# Patient Record
Sex: Male | Born: 1960 | Hispanic: No | Marital: Married | Smoking: Never smoker
Health system: Southern US, Community
[De-identification: ages and names within clinical notes are randomized; demographics above are authoritative.]

## PROBLEM LIST (undated history)

## (undated) DIAGNOSIS — E785 Hyperlipidemia, unspecified: Secondary | ICD-10-CM

## (undated) DIAGNOSIS — N200 Calculus of kidney: Secondary | ICD-10-CM

## (undated) HISTORY — PX: TONSILLECTOMY AND ADENOIDECTOMY: SUR1326

## (undated) HISTORY — PX: VASECTOMY: SHX75

## (undated) HISTORY — DX: Hyperlipidemia, unspecified: E78.5

## (undated) HISTORY — PX: OTHER SURGICAL HISTORY: SHX169

## (undated) HISTORY — PX: INGUINAL HERNIA REPAIR: SUR1180

## (undated) HISTORY — DX: Calculus of kidney: N20.0

## (undated) HISTORY — PX: KNEE ARTHROSCOPY W/ MEDIAL COLLATERAL LIGAMENT (MCL) REPAIR: SHX1876

---

## 2002-05-25 ENCOUNTER — Ambulatory Visit (HOSPITAL_COMMUNITY): Admission: RE | Admit: 2002-05-25 | Discharge: 2002-05-25 | Payer: Self-pay | Admitting: Surgery

## 2005-05-24 ENCOUNTER — Emergency Department (HOSPITAL_COMMUNITY): Admission: EM | Admit: 2005-05-24 | Discharge: 2005-05-24 | Payer: Self-pay | Admitting: Family Medicine

## 2008-06-18 ENCOUNTER — Encounter: Admission: RE | Admit: 2008-06-18 | Discharge: 2008-06-18 | Payer: Self-pay | Admitting: Internal Medicine

## 2010-11-16 ENCOUNTER — Ambulatory Visit
Admission: RE | Admit: 2010-11-16 | Discharge: 2010-11-16 | Disposition: A | Discharge status: Home / Self Care | Payer: 59 | Source: Ambulatory Visit | Attending: Emergency Medicine | Admitting: Emergency Medicine

## 2010-11-16 ENCOUNTER — Ambulatory Visit (INDEPENDENT_AMBULATORY_CARE_PROVIDER_SITE_OTHER): Payer: 59 | Admitting: Emergency Medicine

## 2010-11-16 ENCOUNTER — Encounter: Payer: Self-pay | Admitting: Emergency Medicine

## 2010-11-16 ENCOUNTER — Other Ambulatory Visit: Payer: Self-pay | Admitting: Emergency Medicine

## 2010-11-16 DIAGNOSIS — M25579 Pain in unspecified ankle and joints of unspecified foot: Secondary | ICD-10-CM

## 2010-11-19 ENCOUNTER — Telehealth (INDEPENDENT_AMBULATORY_CARE_PROVIDER_SITE_OTHER): Payer: Self-pay | Admitting: *Deleted

## 2010-11-24 NOTE — Assessment & Plan Note (Signed)
Summary: FOOT & ANKLE INJ/WSE Room 5   Vital Signs:  Patient Profile:   50 Years Old Male CC:      Left ankle injury turned it this morning Height:     70 inches Weight:      185 pounds O2 Sat:      96 % O2 treatment:    Room Air Temp:     99.4 degrees F oral Pulse rate:   63 / minute Pulse rhythm:   regular Resp:     12 per minute BP sitting:   107 / 74  (left arm) Cuff size:   regular  Pt. in pain?   yes    Location:   ankle    Intensity:   6    Type:       sharp  Vitals Entered By: Emilio Math (November 16, 2010 5:23 PM)                   Current Allergies: No known allergies History of Present Illness History from: patient Chief Complaint: Left ankle injury turned it this morning History of Present Illness: Missed a stair this morning and fell and inverted his L ankle.  Now pain throughout ankle, worst on the outside and with movement  Not using any meds or modalities.  It is swellilng and radiating up leg.    REVIEW OF SYSTEMS Constitutional Symptoms      Denies fever, chills, night sweats, weight loss, weight gain, and fatigue.  Eyes       Denies change in vision, eye pain, eye discharge, glasses, contact lenses, and eye surgery. Ear/Nose/Throat/Mouth       Denies hearing loss/aids, change in hearing, ear pain, ear discharge, dizziness, frequent runny nose, frequent nose bleeds, sinus problems, sore throat, hoarseness, and tooth pain or bleeding.  Respiratory       Denies dry cough, productive cough, wheezing, shortness of breath, asthma, bronchitis, and emphysema/COPD.  Cardiovascular       Denies murmurs, chest pain, and tires easily with exhertion.    Gastrointestinal       Denies stomach pain, nausea/vomiting, diarrhea, constipation, blood in bowel movements, and indigestion. Genitourniary       Denies painful urination, kidney stones, and loss of urinary control. Neurological       Denies paralysis, seizures, and  fainting/blackouts. Musculoskeletal       Complains of muscle pain, joint pain, and decreased range of motion.      Denies joint stiffness, redness, swelling, muscle weakness, and gout.  Skin       Denies bruising, unusual mles/lumps or sores, and hair/skin or nail changes.  Psych       Denies mood changes, temper/anger issues, anxiety/stress, speech problems, depression, and sleep problems.  Past History:  Past Medical History: Unremarkable  Past Surgical History: Lasix Inguinal herniorrhaphy  Family History: Mother, Healthy Father, Healthy  Social History: Non smoker ETOH-yes No DRugs Dir Pschy Unit Physical Exam General appearance: well developed, well nourished, no acute distress MSE: oriented to time, place, and person L ankle: FROM, full strength, eversion painful.  No TTP medial/lateral malleolus, navicular, base of 5th, calcaneus, Achilles, or proximal fibula.  +swelling and TTP ATFL and slightlyl CFL.  No ecchymoses.   Distal NV status intact. Assessment New Problems: ANKLE PAIN, LEFT (ICD-719.47)   Plan New Orders: New Patient Level III [99203] T-DG Ankle Complete*L* [60454] Planning Comments:   Ankle sprain, grade 1-2.  Ice, rest, elevation, ACE wrap.  Xray  is ready by radiology as normal.  Educated on treatment.  Patient prefers minimalist approach.  However If not improving by the end of the week, call office and will get into sports medicine.   The patient and/or caregiver has been counseled thoroughly with regard to medications prescribed including dosage, schedule, interactions, rationale for use, and possible side effects and they verbalize understanding.  Diagnoses and expected course of recovery discussed and will return if not improved as expected or if the condition worsens. Patient and/or caregiver verbalized understanding.   Orders Added: 1)  New Patient Level III [99203] 2)  T-DG Ankle Complete*L* [16109]

## 2010-11-24 NOTE — Progress Notes (Signed)
  Phone Note Outgoing Call Call back at [facility] Phone [phone]   Call placed by: Lajean Saver RN,  November 19, 2010 6:13 PM Call placed to: Patient Action Taken: Phone Call Completed Summary of Call: Callback: Patient reports ankle is much better. Will call if problems and need referral to sports med.

## 2011-02-12 NOTE — Op Note (Signed)
NAME:  Clarence Jordan, Clarence Jordan                       ACCOUNT NO.:  [id number]   MEDICAL RECORD NO.:  [id number]                   PATIENT TYPE:  AMB   LOCATION:  DAY                                  FACILITY:  [facility]   PHYSICIAN:  Sandria Bales. Ezzard Standing, M.D.               DATE OF BIRTH:  [birth year]   DATE OF PROCEDURE:  05/25/2002  DATE OF DISCHARGE:  05/25/2002                                 OPERATIVE REPORT   PREOPERATIVE DIAGNOSIS:  Left inguinal hernia.   POSTOPERATIVE DIAGNOSIS:  Moderate size indirect left inguinal hernia.   PROCEDURE:  Laparoscopic inguinal herniorrhaphy with precut Atrium mesh.   ANESTHESIA:  General endotracheal anesthesia.   ESTIMATED BLOOD LOSS:  Minimal.   INDICATIONS FOR PROCEDURE:  Clarence Jordan is a 50 year old white male who has  a symptomatic left inguinal hernia and now comes for repair of this hernia  laparoscopically.  I discussed both open and laparoscopic herniae repair,  both the advantages and disadvantages of each.  He has decided to proceed  with laparoscopic inguinal hernia repair.   DESCRIPTION OF PROCEDURE:  The patient was placed in a supine position with  both his arms tucked.  He had a Foley catheter placed.  He was given 1 gram  Ancef.  His lower abdomen was shaved, prepped with Betadine solution, and  sterilely draped.  An infraumbilical incision was made with sharp dissection  carried down to the rectus abdominis fascia.  Since the hernia is on the  left side, I went to the left of the midline.  I went through the anterior  rectus fascia, retracted the muscle, and inserted the [place] [employer]  preperitoneal balloon into the preperitoneal space down to the pubic bone.  I insufflated the balloon under direct visualization identifying the pubic  bone, inferior epigastric vessels.  When I felt the dissection was  completed, I then removed the balloon and replaced it with a taut Hasson  trocar, this has a 10 cc water filled balloon.  I insufflated  the  preperitoneal space, I had to widen this just a little bit using primarily  the scope as a sweeping motion and inserted two 5 mm trocars, one in the  left lower quadrant about the level about the level of the anterior iliac  spine, and one in the right lower quadrant at the level of the anterior  iliac spine.   I then dissected off the inguinal floor, he had no evidence of a direct  inguinal hernia but where his cord structures exited at the internal ring,  he had an indirect inguinal hernia.  I was able to tease this sac down from  the internal ring, this hernia 4-5 cm though the ring, so I had about a 6 by  8 cm section and I got it all brought down into the peritoneal cavity.  I  did place an endoloop around the base of the sac.  I did not get into the  peritoneal cavity.  Because he had a patulous internal ring and an indirect  hernia, I used the precut Atrium mesh, placed the mesh on the inguinal floor  and around the cord structures.  It was then stapled into place medially to  the pubic bone, inferiorly to Coopers ligament, superiorly to the  transversalis fascia, and then the tail was placed around the cord  structures and stapled to Coopers ligament and then stapled up above the  inguina ligamen where I could palpate laterally.  The mesh lay flat, there  was no bleeding.  I then removed the trocars under direct visualization.  There was  no bleeding.  The umbilical trocar was closed with a 0 Vicryl suture.  The  skin sites were closed with 5-0 Monocryl suture.  Benzoin and Steri-Strips  were placed.  The patient tolerated the procedure well and will be  discharged home today to return to see me in two weeks for follow up.  Call  if he has any problems.                                               Sandria Bales. Ezzard Standing, M.D.    DHN/MEDQ  D:  05/25/2002  T:  05/28/2002  Job:  [id number]   cc:   Ilda Basset, M.D.  [place], [place]

## 2011-07-23 ENCOUNTER — Other Ambulatory Visit: Payer: Self-pay | Admitting: Internal Medicine

## 2011-07-23 DIAGNOSIS — IMO0002 Reserved for concepts with insufficient information to code with codable children: Secondary | ICD-10-CM

## 2011-07-27 ENCOUNTER — Other Ambulatory Visit: Payer: 59

## 2011-09-24 ENCOUNTER — Other Ambulatory Visit (HOSPITAL_COMMUNITY): Payer: Self-pay | Admitting: Internal Medicine

## 2011-09-24 DIAGNOSIS — M545 Low back pain, unspecified: Secondary | ICD-10-CM

## 2011-09-27 ENCOUNTER — Other Ambulatory Visit (HOSPITAL_COMMUNITY): Payer: Self-pay

## 2011-09-27 ENCOUNTER — Ambulatory Visit (HOSPITAL_COMMUNITY)
Admission: RE | Admit: 2011-09-27 | Discharge: 2011-09-27 | Disposition: A | Discharge status: Home / Self Care | Payer: 59 | Source: Ambulatory Visit | Attending: Internal Medicine | Admitting: Internal Medicine

## 2011-09-27 DIAGNOSIS — R209 Unspecified disturbances of skin sensation: Secondary | ICD-10-CM | POA: Insufficient documentation

## 2011-09-27 DIAGNOSIS — M545 Low back pain, unspecified: Secondary | ICD-10-CM | POA: Insufficient documentation

## 2011-09-27 DIAGNOSIS — M48061 Spinal stenosis, lumbar region without neurogenic claudication: Secondary | ICD-10-CM | POA: Insufficient documentation

## 2011-09-27 DIAGNOSIS — M79609 Pain in unspecified limb: Secondary | ICD-10-CM | POA: Insufficient documentation

## 2011-09-27 DIAGNOSIS — M5126 Other intervertebral disc displacement, lumbar region: Secondary | ICD-10-CM | POA: Insufficient documentation

## 2011-12-21 ENCOUNTER — Encounter: Payer: Self-pay | Admitting: Gastroenterology

## 2012-01-19 ENCOUNTER — Ambulatory Visit (AMBULATORY_SURGERY_CENTER): Payer: 59 | Admitting: *Deleted

## 2012-01-19 VITALS — Ht 69.0 in | Wt 191.2 lb

## 2012-01-19 DIAGNOSIS — Z1211 Encounter for screening for malignant neoplasm of colon: Secondary | ICD-10-CM

## 2012-01-19 MED ORDER — MOVIPREP 100 G PO SOLR: ORAL | Status: DC

## 2012-01-28 ENCOUNTER — Other Ambulatory Visit: Payer: Self-pay | Admitting: Gastroenterology

## 2012-01-28 DIAGNOSIS — Z1211 Encounter for screening for malignant neoplasm of colon: Secondary | ICD-10-CM

## 2012-01-28 MED ORDER — MOVIPREP 100 G PO SOLR: ORAL | Status: DC

## 2012-01-28 NOTE — Telephone Encounter (Signed)
Resent Moviprep to [employer]. Left message on # provided in phone note.

## 2012-02-02 ENCOUNTER — Encounter: Payer: Self-pay | Admitting: Gastroenterology

## 2012-02-02 ENCOUNTER — Ambulatory Visit (AMBULATORY_SURGERY_CENTER): Payer: 59 | Admitting: Gastroenterology

## 2012-02-02 ENCOUNTER — Other Ambulatory Visit: Payer: 59 | Admitting: Gastroenterology

## 2012-02-02 VITALS — BP 128/76 | HR 68 | Temp 98.1°F | Resp 18 | Ht 69.0 in | Wt 191.0 lb

## 2012-02-02 DIAGNOSIS — Z1211 Encounter for screening for malignant neoplasm of colon: Secondary | ICD-10-CM

## 2012-02-02 DIAGNOSIS — K5730 Diverticulosis of large intestine without perforation or abscess without bleeding: Secondary | ICD-10-CM

## 2012-02-02 MED ORDER — SODIUM CHLORIDE 0.9 % IV SOLN: 500.0000 mL | INTRAVENOUS | Status: DC

## 2012-02-02 NOTE — Op Note (Signed)
[facility] [address] [place], [place]  [address]  COLONOSCOPY PROCEDURE REPORT  PATIENT:  Clarence Jordan, Clarence Jordan  MR#:  [id number] BIRTHDATE:  [birth year], 51 yrs. old  GENDER:  male ENDOSCOPIST:  Rachael Fee, MD REF. BY:  Kari Baars, M.D. PROCEDURE DATE:  02/02/2012 PROCEDURE:  Colonoscopy 32951 ASA CLASS:  Class II INDICATIONS:  Routine Risk Screening MEDICATIONS:   Fentanyl 50 mcg IV, These medications were titrated to patient response per physician's verbal order, Versed 6 mg IV  DESCRIPTION OF PROCEDURE:   After the risks benefits and alternatives of the procedure were thoroughly explained, informed consent was obtained.  Digital rectal exam was performed and revealed no rectal masses.   The LB [id number]-[id number] endoscope was introduced through the anus and advanced to the cecum, which was identified by both the appendix and ileocecal valve, without limitations.  The quality of the prep was good..  The instrument was then slowly withdrawn as the colon was fully examined. <<PROCEDUREIMAGES>> FINDINGS:  Mild diverticulosis was found in the sigmoid to descending colon segments (see image3).  This was otherwise a normal examination of the colon (see image1, image2, and image4). Retroflexed views in the rectum revealed no abnormalities. COMPLICATIONS:  None  ENDOSCOPIC IMPRESSION: 1) Mild diverticulosis in the sigmoid to descending colon segments 2) Otherwise normal examination; no polyps or cancers  RECOMMENDATIONS: 1) You should continue to follow colorectal cancer screening guidelines for "routine risk" patients with a repeat colonoscopy in 10 years. There is no need for FOBT (stool) testing for at least 5 years.  REPEAT EXAM:  10 years  ______________________________ Rachael Fee, MD  n. eSIGNED:   Rachael Fee at 02/02/2012 11:49 AM  Dellis Filbert, [phone]

## 2012-02-02 NOTE — Patient Instructions (Signed)

## 2012-02-02 NOTE — Progress Notes (Signed)
Patient did not experience any of the following events: a burn prior to discharge; a fall within the facility; wrong site/side/patient/procedure/implant event; or a hospital transfer or hospital admission upon discharge from the facility. (G8907) Patient did not have preoperative order for IV antibiotic SSI prophylaxis. (G8918)  

## 2012-02-03 ENCOUNTER — Telehealth: Payer: Self-pay | Admitting: *Deleted

## 2012-02-03 NOTE — Telephone Encounter (Signed)
Left message to call if questions or concerns.  

## 2014-01-18 ENCOUNTER — Ambulatory Visit (INDEPENDENT_AMBULATORY_CARE_PROVIDER_SITE_OTHER): Payer: 59 | Admitting: Surgery

## 2014-02-06 ENCOUNTER — Encounter (INDEPENDENT_AMBULATORY_CARE_PROVIDER_SITE_OTHER): Payer: Self-pay | Admitting: Surgery

## 2014-02-06 ENCOUNTER — Ambulatory Visit (INDEPENDENT_AMBULATORY_CARE_PROVIDER_SITE_OTHER): Payer: Managed Care, Other (non HMO) | Admitting: Surgery

## 2014-02-06 VITALS — BP 126/70 | HR 67 | Temp 97.1°F | Resp 18 | Ht 71.0 in | Wt 185.0 lb

## 2014-02-06 DIAGNOSIS — K4090 Unilateral inguinal hernia, without obstruction or gangrene, not specified as recurrent: Secondary | ICD-10-CM

## 2014-02-06 NOTE — Progress Notes (Signed)
Re:   Clarence Jordan DOB:   09-17-1961 MRN:   [id number]  ASSESSMENT AND PLAN: 1.  Right inguinal hernia   I discussed the indications and complications of hernia surgery with the patient.  I discussed both the laparoscopic and open approach to hernia repair..  The potential risks of hernia surgery include, but are not limited to, bleeding, infection, open surgery, nerve injury, and recurrence of the hernia.  I provided the patient literature about hernia surgery.  I did his left inguinal hernia laparoscopically.  I discussed the problem getting into the same peritoneal field, but I could try the surgery laparoscopically.  So we will proceed with a laparoscopic repair of the right inguinal hernia.  I think he is looking at the end of the summer to get his hernia fixed.  2.  Hyperlipidemia 3.  Nephrolithiasis  Chief Complaint  Patient presents with  . New Evaluation    R/H   REFERRING PHYSICIAN: Marton Redwood, MD  HISTORY OF PRESENT ILLNESS: Clarence Jordan is a 53 y.o. (DOB: 11-May-1961)  white  male whose primary care physician is Marton Redwood, MD and comes to me today for right inguinal hernia.  The patient is an old patient of mine whom I repaired a left inguinal hernia laparoscopically on 05/25/2002.  He had a moderate sized indirect inguinal hernia.  A couple of months ago, he noticed a bulge in his right groin.  He recently moved and noticed that the hernia enlarged.  He has had no pain or vomiting. He now comes to discuss repair.  Past Medical History  Diagnosis Date  . Hyperlipidemia   . Kidney stones       Past Surgical History  Procedure Laterality Date  . Tonsillectomy and adenoidectomy    . Fx orbital floor repair      rt.  . Inguinal hernia repair      left  . Lasik surgury      O.U.  . Vasectomy    . Knee arthroscopy w/ medial collateral ligament (mcl) repair      left      Current Outpatient Prescriptions  Medication Sig Dispense Refill  .  MOVIPREP 100 G SOLR movi prep as directed  1 each  0   No current facility-administered medications for this visit.      Allergies  Allergen Reactions  . Sudafed [Pseudoephedrine Hcl]     Makes him hyper    REVIEW OF SYSTEMS: Skin:  No history of rash.  No history of abnormal moles. Infection:  No history of hepatitis or HIV.  No history of MRSA. Neurologic:  No history of stroke.  No history of seizure.  No history of headaches. Cardiac:  No history of hypertension. No history of heart disease.  No history of prior cardiac catheterization.  No history of seeing a cardiologist. Pulmonary:  Does not smoke cigarettes.  No asthma or bronchitis.  No OSA/CPAP.  Endocrine:  No diabetes. No thyroid disease. Gastrointestinal:  No history of stomach disease.  No history of liver disease.  No history of gall bladder disease.  No history of pancreas disease.  Colonoscopy - 02/02/2012 by Dr. Ardis Hughs. Urologic:  No history of kidney stones.  No history of bladder infections. Musculoskeletal:  No history of joint or back disease. Hematologic:  No bleeding disorder.  No history of anemia.  Not anticoagulated. Psycho-social:  The patient is oriented.   The patient has no obvious psychologic or social impairment to understanding our  conversation and plan.  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 Married with two children. He just moved to a new house in [place].  PHYSICAL EXAM: BP 126/70  Pulse 67  Temp(Src) 97.1 F (36.2 C)  Resp 18  Ht 5\' 11"  (1.803 m)  Wt 185 lb (83.915 kg)  BMI 25.81 kg/m2  General: WN WM who is alert and generally healthy appearing.  HEENT: Normal. Pupils equal. Neck: Supple. No mass.  No thyroid mass. Lymph Nodes:  No supraclavicular or cervical nodes. Lungs: Clear to auscultation and symmetric breath sounds. Heart:  RRR. No murmur or rub. Abdomen: Soft. No mass. No tenderness.  Normal bowel sounds.  Infraumbilical scar.  Moderate  reducible right inguinal hernia. Rectal: Not done. Extremities:  Good strength and ROM  in upper and lower extremities. Neurologic:  Grossly intact to motor and sensory function. Psychiatric: Has normal mood and affect. Behavior is normal.   DATA REVIEWED: Notes from Dr. Brigitte Pulse.  Alphonsa Overall, MD,  [facility], [address]  [place], [place]    [place] Phone:  [phone] FAX:  [phone]

## 2015-11-10 DIAGNOSIS — L2089 Other atopic dermatitis: Secondary | ICD-10-CM | POA: Diagnosis not present

## 2016-04-23 DIAGNOSIS — L2089 Other atopic dermatitis: Secondary | ICD-10-CM | POA: Diagnosis not present

## 2016-05-29 DIAGNOSIS — H524 Presbyopia: Secondary | ICD-10-CM | POA: Diagnosis not present

## 2016-07-21 DIAGNOSIS — E784 Other hyperlipidemia: Secondary | ICD-10-CM | POA: Diagnosis not present

## 2016-07-21 DIAGNOSIS — Z0000 Encounter for general adult medical examination without abnormal findings: Secondary | ICD-10-CM | POA: Diagnosis not present

## 2016-07-21 DIAGNOSIS — Z125 Encounter for screening for malignant neoplasm of prostate: Secondary | ICD-10-CM | POA: Diagnosis not present

## 2016-07-28 DIAGNOSIS — Z0000 Encounter for general adult medical examination without abnormal findings: Secondary | ICD-10-CM | POA: Diagnosis not present

## 2016-07-28 DIAGNOSIS — Z1389 Encounter for screening for other disorder: Secondary | ICD-10-CM | POA: Diagnosis not present

## 2016-07-28 DIAGNOSIS — K4090 Unilateral inguinal hernia, without obstruction or gangrene, not specified as recurrent: Secondary | ICD-10-CM | POA: Diagnosis not present

## 2016-07-28 DIAGNOSIS — M5416 Radiculopathy, lumbar region: Secondary | ICD-10-CM | POA: Diagnosis not present

## 2016-07-28 DIAGNOSIS — Z1212 Encounter for screening for malignant neoplasm of rectum: Secondary | ICD-10-CM | POA: Diagnosis not present

## 2016-07-28 DIAGNOSIS — Z23 Encounter for immunization: Secondary | ICD-10-CM | POA: Diagnosis not present

## 2016-07-28 DIAGNOSIS — Z6825 Body mass index (BMI) 25.0-25.9, adult: Secondary | ICD-10-CM | POA: Diagnosis not present

## 2016-07-28 DIAGNOSIS — E784 Other hyperlipidemia: Secondary | ICD-10-CM | POA: Diagnosis not present

## 2016-11-29 DIAGNOSIS — L2084 Intrinsic (allergic) eczema: Secondary | ICD-10-CM | POA: Diagnosis not present

## 2016-12-23 DIAGNOSIS — D485 Neoplasm of uncertain behavior of skin: Secondary | ICD-10-CM | POA: Diagnosis not present

## 2017-06-05 DIAGNOSIS — H524 Presbyopia: Secondary | ICD-10-CM | POA: Diagnosis not present

## 2017-07-05 ENCOUNTER — Emergency Department
Admission: EM | Admit: 2017-07-05 | Discharge: 2017-07-05 | Disposition: A | Discharge status: Home / Self Care | Payer: 59 | Source: Home / Self Care | Attending: Family Medicine | Admitting: Family Medicine

## 2017-07-05 ENCOUNTER — Encounter: Payer: Self-pay | Admitting: Emergency Medicine

## 2017-07-05 DIAGNOSIS — L03113 Cellulitis of right upper limb: Secondary | ICD-10-CM | POA: Diagnosis not present

## 2017-07-05 DIAGNOSIS — M7021 Olecranon bursitis, right elbow: Secondary | ICD-10-CM

## 2017-07-05 MED ORDER — CLINDAMYCIN HCL 300 MG PO CAPS: 300.0000 mg | ORAL_CAPSULE | Freq: Four times a day (QID) | ORAL | 0 refills | Status: DC

## 2017-07-05 NOTE — Discharge Instructions (Signed)
° °  Please take antibiotics as prescribed and be sure to complete entire course even if you start to feel better to ensure infection does not come back.  To help prevent stomach upset- nausea, vomiting, diarrhea/loose stools, it is recommended you take this antibiotic with food and take an over the counter probiotic or eat yogurt with probiotics.

## 2017-07-05 NOTE — ED Provider Notes (Signed)
[facility]    CSN: [id number] Arrival date & time: 07/05/17  1758     History   Chief Complaint Chief Complaint  Patient presents with  . Joint Swelling    HPI Clarence Jordan is a 56 y.o. male.   HPI  Clarence Jordan is a 56 y.o. male presenting to UC with c/o Right elbow pain, redness, swelling and warmth that started this morning.  Pain is aching and sore, 5/10. He did notice a small scab on same elbow about 2-3 days ago.  Denies known injury.  He was doing several hours of yard work yesterday. Denies fever, chills, n/v/d. No prior hx of bursitis.     Past Medical History:  Diagnosis Date  . Hyperlipidemia   . Kidney stones     Patient Active Problem List   Diagnosis Date Noted  . Inguinal hernia, right 02/06/2014  . ANKLE PAIN, LEFT 11/16/2010    Past Surgical History:  Procedure Laterality Date  . fx orbital floor repair     rt.  . INGUINAL HERNIA REPAIR     left  . KNEE ARTHROSCOPY W/ MEDIAL COLLATERAL LIGAMENT (MCL) REPAIR     left  . lasik surgury     O.U.  . TONSILLECTOMY AND ADENOIDECTOMY    . VASECTOMY         Home Medications    Prior to Admission medications   Medication Sig Start Date End Date Taking? Authorizing Provider  clindamycin (CLEOCIN) 300 MG capsule Take 1 capsule (300 mg total) by mouth 4 (four) times daily. X 7 days 07/05/17   Noe Gens, PA-C  MOVIPREP 100 G SOLR movi prep as directed 01/28/12   Milus Banister, MD    Family History History reviewed. No pertinent family history.  Social History Social History  Substance Use Topics  . Smoking status: Never Smoker  . Smokeless tobacco: Former Systems developer    Quit date: 01/19/1979  . Alcohol use 0.6 oz/week    1 Cans of beer per week     Allergies   Sudafed [pseudoephedrine hcl]   Review of Systems Review of Systems  Constitutional: Negative for chills and fever.  Gastrointestinal: Negative for diarrhea, nausea and vomiting.  Musculoskeletal: Positive  for arthralgias, joint swelling and myalgias.  Skin: Positive for color change and wound.  Neurological: Negative for weakness and light-headedness.     Physical Exam Triage Vital Signs ED Triage Vitals  Enc Vitals Group     BP 07/05/17 1826 129/77     Pulse Rate 07/05/17 1826 69     Resp --      Temp 07/05/17 1826 98.4 F (36.9 C)     Temp Source 07/05/17 1826 Oral     SpO2 07/05/17 1826 99 %     Weight 07/05/17 1833 190 lb (86.2 kg)     Height --      Head Circumference --      Peak Flow --      Pain Score 07/05/17 1833 5     Pain Loc --      Pain Edu? --      Excl. in Tower? --    No data found.   Updated Vital Signs BP 129/77 (BP Location: Right Arm)   Pulse 69   Temp 98.4 F (36.9 C) (Oral)   Wt 190 lb (86.2 kg)   SpO2 99%   BMI 26.50 kg/m   Visual Acuity Right Eye Distance:   Left Eye  Distance:   Bilateral Distance:    Right Eye Near:   Left Eye Near:    Bilateral Near:     Physical Exam  Constitutional: He is oriented to person, place, and time. He appears well-developed and well-nourished. No distress.  HENT:  Head: Normocephalic and atraumatic.  Mouth/Throat: Oropharynx is clear and moist.  Eyes: EOM are normal.  Neck: Normal range of motion.  Cardiovascular: Normal rate.   Pulses:      Radial pulses are 2+ on the right side.  Pulmonary/Chest: Effort normal. No respiratory distress.  Musculoskeletal: Normal range of motion. He exhibits edema and tenderness.  Right elbow: mild to moderate edema of olecranon bursa, mildly tender. Slight decreased flexion due to swelling and discomfort. Full flexion. 5/5 grip strength.   Neurological: He is alert and oriented to person, place, and time.  Skin: Skin is warm and dry. Capillary refill takes less than 2 seconds. He is not diaphoretic. There is erythema.  Right elbow: 58mm scab over olecranon, 2x3cm area of erythema and warmth over olecranon bursa.   Psychiatric: He has a normal mood and affect. His  behavior is normal.  Nursing note and vitals reviewed.    UC Treatments / Results  Labs (all labs ordered are listed, but only abnormal results are displayed) Labs Reviewed - No data to display  EKG  EKG Interpretation None       Radiology No results found.  Procedures Procedures (including critical care time)  Medications Ordered in UC Medications - No data to display   Initial Impression / Assessment and Plan / UC Course  I have reviewed the triage vital signs and the nursing notes.  Pertinent labs & imaging results that were available during my care of the patient were reviewed by me and considered in my medical decision making (see chart for details).     Hx and exam c/w cellulitis over olecranon bursa w/o evidence of septic joint.  Will start on clindamycin  F/u with PCP in 3-4 days if not improving, sooner if significantly worsening.  Pt info packet provided.   Final Clinical Impressions(s) / UC Diagnoses   Final diagnoses:  Cellulitis of right elbow  Olecranon bursitis of right elbow    New Prescriptions New Prescriptions   CLINDAMYCIN (CLEOCIN) 300 MG CAPSULE    Take 1 capsule (300 mg total) by mouth 4 (four) times daily. X 7 days     Controlled Substance Prescriptions [place] Controlled Substance Registry consulted? Not Applicable   Tyrell Antonio 07/05/17 1901

## 2017-07-05 NOTE — ED Triage Notes (Signed)
Pt c/o right elbow pain, redness and warmth that started today. Pain radiates  to shoulder. Denies meds.

## 2017-07-07 ENCOUNTER — Telehealth: Payer: Self-pay | Admitting: *Deleted

## 2017-07-07 DIAGNOSIS — M7021 Olecranon bursitis, right elbow: Secondary | ICD-10-CM | POA: Diagnosis not present

## 2017-07-07 DIAGNOSIS — M5416 Radiculopathy, lumbar region: Secondary | ICD-10-CM | POA: Diagnosis not present

## 2017-07-07 DIAGNOSIS — Z6827 Body mass index (BMI) 27.0-27.9, adult: Secondary | ICD-10-CM | POA: Diagnosis not present

## 2017-07-07 NOTE — Telephone Encounter (Signed)
Callback: Patient's wife reports Clarence Jordan's elbow worsened. He went to his PCP who changed the antibiotic and drew margins. If not improving he will see ortho.

## 2017-07-25 DIAGNOSIS — M65321 Trigger finger, right index finger: Secondary | ICD-10-CM | POA: Diagnosis not present

## 2017-07-25 DIAGNOSIS — M7021 Olecranon bursitis, right elbow: Secondary | ICD-10-CM | POA: Diagnosis not present

## 2017-07-25 DIAGNOSIS — M65341 Trigger finger, right ring finger: Secondary | ICD-10-CM | POA: Diagnosis not present

## 2017-07-25 DIAGNOSIS — M65331 Trigger finger, right middle finger: Secondary | ICD-10-CM | POA: Diagnosis not present

## 2017-07-25 DIAGNOSIS — M65342 Trigger finger, left ring finger: Secondary | ICD-10-CM | POA: Diagnosis not present

## 2017-08-03 DIAGNOSIS — Z125 Encounter for screening for malignant neoplasm of prostate: Secondary | ICD-10-CM | POA: Diagnosis not present

## 2017-08-03 DIAGNOSIS — Z0000 Encounter for general adult medical examination without abnormal findings: Secondary | ICD-10-CM | POA: Diagnosis not present

## 2017-08-03 DIAGNOSIS — R82998 Other abnormal findings in urine: Secondary | ICD-10-CM | POA: Diagnosis not present

## 2017-08-10 DIAGNOSIS — Z1389 Encounter for screening for other disorder: Secondary | ICD-10-CM | POA: Diagnosis not present

## 2017-08-10 DIAGNOSIS — K4090 Unilateral inguinal hernia, without obstruction or gangrene, not specified as recurrent: Secondary | ICD-10-CM | POA: Diagnosis not present

## 2017-08-10 DIAGNOSIS — Z6829 Body mass index (BMI) 29.0-29.9, adult: Secondary | ICD-10-CM | POA: Diagnosis not present

## 2017-08-10 DIAGNOSIS — E7849 Other hyperlipidemia: Secondary | ICD-10-CM | POA: Diagnosis not present

## 2017-08-10 DIAGNOSIS — M6530 Trigger finger, unspecified finger: Secondary | ICD-10-CM | POA: Diagnosis not present

## 2017-08-10 DIAGNOSIS — Z0000 Encounter for general adult medical examination without abnormal findings: Secondary | ICD-10-CM | POA: Diagnosis not present

## 2017-08-10 DIAGNOSIS — M79641 Pain in right hand: Secondary | ICD-10-CM | POA: Diagnosis not present

## 2017-08-10 DIAGNOSIS — L308 Other specified dermatitis: Secondary | ICD-10-CM | POA: Diagnosis not present

## 2017-08-10 DIAGNOSIS — M2550 Pain in unspecified joint: Secondary | ICD-10-CM | POA: Diagnosis not present

## 2017-08-10 DIAGNOSIS — Z8719 Personal history of other diseases of the digestive system: Secondary | ICD-10-CM | POA: Diagnosis not present

## 2017-08-16 DIAGNOSIS — M65321 Trigger finger, right index finger: Secondary | ICD-10-CM | POA: Diagnosis not present

## 2017-08-16 DIAGNOSIS — M65331 Trigger finger, right middle finger: Secondary | ICD-10-CM | POA: Diagnosis not present

## 2017-08-16 DIAGNOSIS — M7021 Olecranon bursitis, right elbow: Secondary | ICD-10-CM | POA: Diagnosis not present

## 2017-08-16 DIAGNOSIS — M65341 Trigger finger, right ring finger: Secondary | ICD-10-CM | POA: Diagnosis not present

## 2017-09-13 DIAGNOSIS — M65321 Trigger finger, right index finger: Secondary | ICD-10-CM | POA: Diagnosis not present

## 2017-09-13 DIAGNOSIS — M65341 Trigger finger, right ring finger: Secondary | ICD-10-CM | POA: Diagnosis not present

## 2017-09-13 DIAGNOSIS — M79642 Pain in left hand: Secondary | ICD-10-CM | POA: Diagnosis not present

## 2017-09-13 DIAGNOSIS — M7021 Olecranon bursitis, right elbow: Secondary | ICD-10-CM | POA: Diagnosis not present

## 2017-09-13 DIAGNOSIS — M65331 Trigger finger, right middle finger: Secondary | ICD-10-CM | POA: Diagnosis not present

## 2017-09-13 DIAGNOSIS — M7712 Lateral epicondylitis, left elbow: Secondary | ICD-10-CM | POA: Diagnosis not present

## 2017-09-23 NOTE — Progress Notes (Signed)
Office Visit Note  Patient: Clarence Jordan             Date of Birth: December 25, 1960           MRN: [id number]             PCP: Marton Redwood, MD Referring: Marton Redwood, MD Visit Date: 09/26/2017 Occupation: health care management    Subjective:  Other (pain in both hands )   History of Present Illness: Clarence Jordan is a 56 y.o. male seen in consultation per his PCP for evaluation of arthralgias. According to patient he has had history of lower back pain since 1993. He states at the time he was diagnosed with a spondylosis. He was evaluated by an orthopedic surgeon and was referred to physical therapy. He has had off and on flares of his lower back pain since then. He is also had history of right foot Morton's neuroma which comes and goes. He states about 2-1/2 months ago he was blowing leaves and fell and injured his right thumb which was painful and swollen and gradually the symptoms have been improving. He also started having pain in his bilateral elbows. He states one day he woke up with right elbow pain with swelling and redness. He went to the emergency room where he was diagnosed with chronic renal bursitis. He states he was treated with doxycycline and the symptoms improved. He was also diagnosed with right second, third and fourth trigger finger. He was experiencing decreased grip strength. He was seen by Dr. Burney Gauze who injected his right third trigger finger which is doing better. He also examined his elbows and diagnosed her with epicondylitis. He was given tennis elbow brace. He denies any history of joint swelling. He states he has had dermatitis for several years for which she's been seeing dermatologist and has been diagnosed as eczema. He was recently seen by his PCP and had lab work which came positive for HLA-B27. He denies any history of uveitis, iritis, psoriasis, inflammatory bowel disease.  Activities of Daily Living:  Patient reports morning stiffness for 30  minutes.   Patient Reports nocturnal pain.  Difficulty dressing/grooming: Denies Difficulty climbing stairs: Denies Difficulty getting out of chair: Denies Difficulty using hands for taps, buttons, cutlery, and/or writing: Denies   Review of Systems  Constitutional: Negative for fatigue, night sweats and weakness ( ).  HENT: Negative for mouth sores, mouth dryness and nose dryness.   Eyes: Negative for redness and dryness.  Respiratory: Negative for shortness of breath and difficulty breathing.   Cardiovascular: Negative for chest pain, palpitations, hypertension, irregular heartbeat and swelling in legs/feet.  Gastrointestinal: Negative for constipation and diarrhea.  Endocrine: Negative for increased urination.  Musculoskeletal: Positive for arthralgias, joint pain and morning stiffness. Negative for joint swelling, myalgias, muscle weakness, muscle tenderness and myalgias.  Skin: Positive for rash. Negative for color change, hair loss, nodules/bumps, skin tightness, ulcers and sensitivity to sunlight.       Eczema/dermatitis- followed up by dermatologist  Dry scales noted on his bilateral hands  Allergic/Immunologic: Negative for susceptible to infections.  Neurological: Negative for dizziness, fainting, memory loss and night sweats.  Hematological: Negative for swollen glands.  Psychiatric/Behavioral: Positive for sleep disturbance. Negative for depressed mood. The patient is not nervous/anxious.     PMFS History:  Patient Active Problem List   Diagnosis Date Noted  . Inguinal hernia, right 02/06/2014  . ANKLE PAIN, LEFT 11/16/2010    Past Medical History:  Diagnosis Date  .  Hyperlipidemia   . Kidney stones     Family History  Problem Relation Age of Onset  . Rheum arthritis Father   . COPD Father   . Atrial fibrillation Father   . Diabetes Father   . Hypertension Father   . High Cholesterol Father   . Healthy Son   . Healthy Daughter    Past Surgical History:    Procedure Laterality Date  . fx orbital floor repair     rt.  . INGUINAL HERNIA REPAIR     left  . KNEE ARTHROSCOPY W/ MEDIAL COLLATERAL LIGAMENT (MCL) REPAIR     left  . lasik surgury     O.U.  . TONSILLECTOMY AND ADENOIDECTOMY    . VASECTOMY     Social History   Social History Narrative  . Not on file     Objective: Vital Signs: BP 117/68 (BP Location: Right Arm, Patient Position: Sitting, Cuff Size: Normal)   Pulse 60   Resp 18   Ht 5' 8"  (1.727 m)   Wt 196 lb (88.9 kg)   BMI 29.80 kg/m    Physical Exam  Constitutional: He is oriented to person, place, and time. He appears well-developed and well-nourished.  HENT:  Head: Normocephalic and atraumatic.  Eyes: Conjunctivae and EOM are normal. Pupils are equal, round, and reactive to light.  Neck: Normal range of motion. Neck supple.  Cardiovascular: Normal rate, regular rhythm and normal heart sounds.  Pulmonary/Chest: Effort normal and breath sounds normal.  Abdominal: Soft. Bowel sounds are normal.  Neurological: He is alert and oriented to person, place, and time.  Skin: Skin is warm and dry. Capillary refill takes less than 2 seconds.  Psychiatric: He has a normal mood and affect. His behavior is normal.  Nursing note and vitals reviewed.    Musculoskeletal Exam: C-spine and thoracic lumbar spine good range of motion. He was able to reach his toes with his hands. Shoulder joints, elbow joints, wrist joints, MCPs PIPs with good range of motion. He has some thickening of PIP/DIP joints in his hands consistent with osteoarthritis. No synovitis was noted. Hip joints, knee joints, ankles and MTPs were good range of motion with no synovitis. He has surgical scar on his left knee prior to previous meniscal tear repair. He had positive tenderness over bilateral lateral epicondyle area consistent with epicondylitis.  CDAI Exam: No CDAI exam completed.    Investigation: Findings:  08/03/2017 UA negative, CBC normal, CMP  normal lipid panel cholesterol 233 LDL 159, TSH 3. due to normal, PSA normal, ESR 2, ANA negative, RF negative, anti-CCP negative    Imaging: Xr Foot 2 Views Left  Result Date: 09/26/2017 Minimal PIP/DIP narrowing was noted. No MTP joint involvement or intertarsal joint space narrowing was noted. No calcaneal spurs were noted. Impression: Mild osteoarthritis of the foot.  Xr Foot 2 Views Right  Result Date: 09/26/2017 Minimal PIP/DIP narrowing was noted. No MTP joint involvement or intertarsal joint space narrowing was noted. No calcaneal spurs were noted. Impression: Mild osteoarthritis of the foot.  Xr Hand 2 View Left  Result Date: 09/26/2017 PIP DIP narrowing was noted. No MCP joint or intercarpal joint space narrowing was noted. No erosive changes were noted. Impression: These findings are consistent with osteoarthritis of the hand.  Xr Hand 2 View Right  Result Date: 09/26/2017 PIP DIP narrowing was noted. No MCP joint or intercarpal joint space narrowing was noted. No erosive changes were noted. Impression: These findings are consistent with osteoarthritis  of the hand.  Xr Lumbar Spine 2-3 Views  Result Date: 09/26/2017 No disc space narrowing was noted. L5-S1 narrowing was noted. No syndesmophytes were noted. Impression: Mild is spondylosis of the lumbar spine with L5-S1 narrowing.  Xr Pelvis 1-2 Views  Result Date: 09/26/2017 SI joints appear normal without any sclerosis or narrowing. Impression: Normal x-ray of the SI joints.   Speciality Comments: No specialty comments available.    Procedures:  No procedures performed Allergies: Sudafed [pseudoephedrine hcl]   Assessment / Plan:     Visit Diagnoses: Pain in both hands - Plan: XR Hand 2 View Right, XR Hand 2 View Left. The clinical and x-ray findings were consistent with osteoarthritis. No synovitis was noted on examination. Joint protection and muscle strengthening was discussed. Natural anti-inflammatories  were discussed.  Pain in both feet - Plan: XR Foot 2 Views Right, XR Foot 2 Views Left. Clinical finding and radiographic findings are consistent with osteoarthritis.  HLA B27 positive: Patient has no evidence of autoimmune or inflammatory process on examination currently. He denies any history of psoriasis or inflammatory bowel disease. There is no history of uveitis. There is no synovitis on examination. Association of HLA-B27 given and normal population was discussed.  Chronic midline low back pain without sciatica - Plan: XR Lumbar Spine 2-3 Views, XR Pelvis 1-2 Views. X-rays shows mild spondylosis and L5-S1 narrowing. I also reviewed his prior MRI which was consistent with hormonal space narrowing and mild spondylosis. SI joint x-rays did not reveal any sclerosis.  Dyslipidemia  History of diverticulitis  History of eczema    Orders: Orders Placed This Encounter  Procedures  . XR Hand 2 View Right  . XR Hand 2 View Left  . XR Foot 2 Views Right  . XR Foot 2 Views Left  . XR Lumbar Spine 2-3 Views  . XR Pelvis 1-2 Views   No orders of the defined types were placed in this encounter.   Face-to-face time spent with patient was 45 minutes. Greater than 50% of time was spent in counseling and coordination of care.  Follow-Up Instructions: Return if symptoms worsen or fail to improve, for Osteoarthritis.   Bo Merino, MD  Note - This record has been created using Editor, commissioning.  Chart creation errors have been sought, but may not always  have been located. Such creation errors do not reflect on  the standard of medical care.

## 2017-09-26 ENCOUNTER — Ambulatory Visit (INDEPENDENT_AMBULATORY_CARE_PROVIDER_SITE_OTHER): Payer: Self-pay

## 2017-09-26 ENCOUNTER — Ambulatory Visit: Payer: 59 | Admitting: Rheumatology

## 2017-09-26 ENCOUNTER — Encounter: Payer: Self-pay | Admitting: Rheumatology

## 2017-09-26 VITALS — BP 117/68 | HR 60 | Resp 18 | Ht 68.0 in | Wt 196.0 lb

## 2017-09-26 DIAGNOSIS — G8929 Other chronic pain: Secondary | ICD-10-CM

## 2017-09-26 DIAGNOSIS — Z872 Personal history of diseases of the skin and subcutaneous tissue: Secondary | ICD-10-CM

## 2017-09-26 DIAGNOSIS — M79671 Pain in right foot: Secondary | ICD-10-CM

## 2017-09-26 DIAGNOSIS — Z8719 Personal history of other diseases of the digestive system: Secondary | ICD-10-CM

## 2017-09-26 DIAGNOSIS — M545 Low back pain, unspecified: Secondary | ICD-10-CM

## 2017-09-26 DIAGNOSIS — M79642 Pain in left hand: Secondary | ICD-10-CM

## 2017-09-26 DIAGNOSIS — E785 Hyperlipidemia, unspecified: Secondary | ICD-10-CM

## 2017-09-26 DIAGNOSIS — Z1589 Genetic susceptibility to other disease: Secondary | ICD-10-CM

## 2017-09-26 DIAGNOSIS — M79672 Pain in left foot: Secondary | ICD-10-CM

## 2017-09-26 DIAGNOSIS — M79641 Pain in right hand: Secondary | ICD-10-CM

## 2017-09-26 NOTE — Patient Instructions (Signed)
Natural anti-inflammatories  You can purchase these at [employer], [employer] or online.  . Turmeric (capsules)  . Ginger (ginger root or capsules)  . Omega 3 (Fish, flax seeds, chia seeds, walnuts, almonds)  . Tart cherry (dried, tablets or extract)   Patient should be under the care of a physician while taking these supplements. This may not be reproduced without the permission of Dr. Bo Merino.

## 2018-06-20 DIAGNOSIS — H524 Presbyopia: Secondary | ICD-10-CM | POA: Diagnosis not present

## 2018-08-15 DIAGNOSIS — Z0000 Encounter for general adult medical examination without abnormal findings: Secondary | ICD-10-CM | POA: Diagnosis not present

## 2018-08-15 DIAGNOSIS — E7849 Other hyperlipidemia: Secondary | ICD-10-CM | POA: Diagnosis not present

## 2018-08-15 DIAGNOSIS — R82998 Other abnormal findings in urine: Secondary | ICD-10-CM | POA: Diagnosis not present

## 2018-08-15 DIAGNOSIS — Z125 Encounter for screening for malignant neoplasm of prostate: Secondary | ICD-10-CM | POA: Diagnosis not present

## 2018-08-17 DIAGNOSIS — Z1212 Encounter for screening for malignant neoplasm of rectum: Secondary | ICD-10-CM | POA: Diagnosis not present

## 2018-08-22 DIAGNOSIS — Z23 Encounter for immunization: Secondary | ICD-10-CM | POA: Diagnosis not present

## 2018-08-22 DIAGNOSIS — M19079 Primary osteoarthritis, unspecified ankle and foot: Secondary | ICD-10-CM | POA: Diagnosis not present

## 2018-08-22 DIAGNOSIS — E7849 Other hyperlipidemia: Secondary | ICD-10-CM | POA: Diagnosis not present

## 2018-08-22 DIAGNOSIS — Z6827 Body mass index (BMI) 27.0-27.9, adult: Secondary | ICD-10-CM | POA: Diagnosis not present

## 2018-08-22 DIAGNOSIS — L308 Other specified dermatitis: Secondary | ICD-10-CM | POA: Diagnosis not present

## 2018-08-22 DIAGNOSIS — Z125 Encounter for screening for malignant neoplasm of prostate: Secondary | ICD-10-CM | POA: Diagnosis not present

## 2018-08-22 DIAGNOSIS — M19041 Primary osteoarthritis, right hand: Secondary | ICD-10-CM | POA: Diagnosis not present

## 2018-08-22 DIAGNOSIS — Z1389 Encounter for screening for other disorder: Secondary | ICD-10-CM | POA: Diagnosis not present

## 2018-08-22 DIAGNOSIS — Z0000 Encounter for general adult medical examination without abnormal findings: Secondary | ICD-10-CM | POA: Diagnosis not present

## 2019-08-24 DIAGNOSIS — E7849 Other hyperlipidemia: Secondary | ICD-10-CM | POA: Diagnosis not present

## 2019-08-24 DIAGNOSIS — Z125 Encounter for screening for malignant neoplasm of prostate: Secondary | ICD-10-CM | POA: Diagnosis not present

## 2019-08-24 DIAGNOSIS — Z0000 Encounter for general adult medical examination without abnormal findings: Secondary | ICD-10-CM | POA: Diagnosis not present

## 2019-08-29 DIAGNOSIS — R82998 Other abnormal findings in urine: Secondary | ICD-10-CM | POA: Diagnosis not present

## 2019-08-31 DIAGNOSIS — Z0000 Encounter for general adult medical examination without abnormal findings: Secondary | ICD-10-CM | POA: Diagnosis not present

## 2019-08-31 DIAGNOSIS — E7849 Other hyperlipidemia: Secondary | ICD-10-CM | POA: Diagnosis not present

## 2019-08-31 DIAGNOSIS — Z1331 Encounter for screening for depression: Secondary | ICD-10-CM | POA: Diagnosis not present

## 2019-08-31 DIAGNOSIS — E785 Hyperlipidemia, unspecified: Secondary | ICD-10-CM | POA: Diagnosis not present

## 2019-08-31 DIAGNOSIS — M19079 Primary osteoarthritis, unspecified ankle and foot: Secondary | ICD-10-CM | POA: Diagnosis not present

## 2019-08-31 DIAGNOSIS — M19041 Primary osteoarthritis, right hand: Secondary | ICD-10-CM | POA: Diagnosis not present

## 2020-04-05 DIAGNOSIS — H524 Presbyopia: Secondary | ICD-10-CM | POA: Diagnosis not present

## 2020-08-28 DIAGNOSIS — R7301 Impaired fasting glucose: Secondary | ICD-10-CM | POA: Diagnosis not present

## 2020-08-28 DIAGNOSIS — Z0000 Encounter for general adult medical examination without abnormal findings: Secondary | ICD-10-CM | POA: Diagnosis not present

## 2020-08-28 DIAGNOSIS — E785 Hyperlipidemia, unspecified: Secondary | ICD-10-CM | POA: Diagnosis not present

## 2020-08-28 DIAGNOSIS — Z125 Encounter for screening for malignant neoplasm of prostate: Secondary | ICD-10-CM | POA: Diagnosis not present

## 2020-09-02 DIAGNOSIS — M4310 Spondylolisthesis, site unspecified: Secondary | ICD-10-CM | POA: Diagnosis not present

## 2020-09-02 DIAGNOSIS — M5416 Radiculopathy, lumbar region: Secondary | ICD-10-CM | POA: Diagnosis not present

## 2020-09-02 DIAGNOSIS — Z1331 Encounter for screening for depression: Secondary | ICD-10-CM | POA: Diagnosis not present

## 2020-09-02 DIAGNOSIS — M19079 Primary osteoarthritis, unspecified ankle and foot: Secondary | ICD-10-CM | POA: Diagnosis not present

## 2020-09-02 DIAGNOSIS — E785 Hyperlipidemia, unspecified: Secondary | ICD-10-CM | POA: Diagnosis not present

## 2020-09-02 DIAGNOSIS — K921 Melena: Secondary | ICD-10-CM | POA: Diagnosis not present

## 2020-09-02 DIAGNOSIS — G4700 Insomnia, unspecified: Secondary | ICD-10-CM | POA: Diagnosis not present

## 2020-09-02 DIAGNOSIS — R7301 Impaired fasting glucose: Secondary | ICD-10-CM | POA: Diagnosis not present

## 2020-09-02 DIAGNOSIS — R82998 Other abnormal findings in urine: Secondary | ICD-10-CM | POA: Diagnosis not present

## 2020-09-02 DIAGNOSIS — M19041 Primary osteoarthritis, right hand: Secondary | ICD-10-CM | POA: Diagnosis not present

## 2020-09-02 DIAGNOSIS — Z1339 Encounter for screening examination for other mental health and behavioral disorders: Secondary | ICD-10-CM | POA: Diagnosis not present

## 2020-09-02 DIAGNOSIS — M6530 Trigger finger, unspecified finger: Secondary | ICD-10-CM | POA: Diagnosis not present

## 2020-09-02 DIAGNOSIS — Z0000 Encounter for general adult medical examination without abnormal findings: Secondary | ICD-10-CM | POA: Diagnosis not present

## 2020-09-04 ENCOUNTER — Other Ambulatory Visit: Payer: Self-pay | Admitting: Internal Medicine

## 2020-09-04 DIAGNOSIS — E785 Hyperlipidemia, unspecified: Secondary | ICD-10-CM

## 2020-09-24 ENCOUNTER — Ambulatory Visit
Admission: RE | Admit: 2020-09-24 | Discharge: 2020-09-24 | Disposition: A | Discharge status: Home / Self Care | Payer: No Typology Code available for payment source | Source: Ambulatory Visit | Attending: Internal Medicine | Admitting: Internal Medicine

## 2020-09-24 DIAGNOSIS — E785 Hyperlipidemia, unspecified: Secondary | ICD-10-CM | POA: Diagnosis not present

## 2020-09-30 ENCOUNTER — Other Ambulatory Visit (HOSPITAL_COMMUNITY): Payer: Self-pay | Admitting: Internal Medicine

## 2020-10-01 ENCOUNTER — Ambulatory Visit: Payer: 59 | Admitting: Gastroenterology

## 2020-10-02 ENCOUNTER — Ambulatory Visit: Payer: 59 | Admitting: Nurse Practitioner

## 2020-10-03 ENCOUNTER — Encounter: Payer: Self-pay | Admitting: Gastroenterology

## 2020-10-16 ENCOUNTER — Other Ambulatory Visit: Payer: Self-pay | Admitting: Gastroenterology

## 2020-10-16 ENCOUNTER — Ambulatory Visit (INDEPENDENT_AMBULATORY_CARE_PROVIDER_SITE_OTHER): Payer: 59 | Admitting: Gastroenterology

## 2020-10-16 ENCOUNTER — Encounter: Payer: Self-pay | Admitting: Gastroenterology

## 2020-10-16 ENCOUNTER — Other Ambulatory Visit: Payer: Self-pay

## 2020-10-16 VITALS — BP 102/68 | HR 74 | Ht 67.5 in | Wt 181.0 lb

## 2020-10-16 DIAGNOSIS — R195 Other fecal abnormalities: Secondary | ICD-10-CM

## 2020-10-16 MED ORDER — NA SULFATE-K SULFATE-MG SULF 17.5-3.13-1.6 GM/177ML PO SOLN: 1.0000 | Freq: Once | ORAL | 0 refills | Status: DC

## 2020-10-16 NOTE — Progress Notes (Signed)
10/16/2020 Clarence Jordan [id number] November 14, 1960   HISTORY OF PRESENT ILLNESS: This is a pleasant 60 year old male is a patient of Dr. Ardis Hughs known to him only for colonoscopy in May 2013.  At that time he was found to have only diverticulosis and was otherwise normal so repeat was recommended a 10-year interval.  Patient is here today at the request of his PCP, Dr. Brigitte Pulse, for positive Hemosure/fecal immunochemistry testing.  The patient had this done as part of his routine physical.  He denies seeing any blood in his stools or black stools.  He tells me that his recent hemoglobin was 16 g.  We do not have any of his lab results unfortunately.  He says that he feels well with no GI complaints.  He does admit to taking ibuprofen daily for arthritis.  He says he moves his bowels regularly with no issues.  Denies abdominal pain.   Past Medical History:  Diagnosis Date  . Hyperlipidemia   . Kidney stones    Past Surgical History:  Procedure Laterality Date  . fx orbital floor repair     rt.  . INGUINAL HERNIA REPAIR     left  . KNEE ARTHROSCOPY W/ MEDIAL COLLATERAL LIGAMENT (MCL) REPAIR     left  . lasik surgury     O.U.  . TONSILLECTOMY AND ADENOIDECTOMY    . VASECTOMY      reports that he has never smoked. He quit smokeless tobacco use about 41 years ago. He reports current alcohol use of about 1.0 standard drink of alcohol per week. He reports that he does not use drugs. family history includes Atrial fibrillation in his father; COPD in his father; Diabetes in his father; Healthy in his daughter and son; High Cholesterol in his father; Hypertension in his father; Rheum arthritis in his father. Allergies  Allergen Reactions  . Sudafed [Pseudoephedrine Hcl]     Makes him hyper      Outpatient Encounter Medications as of 10/16/2020  Medication Sig  . ibuprofen (ADVIL,MOTRIN) 200 MG tablet Take 200 mg by mouth as needed.  . Multiple Vitamins-Minerals (MULTIVITAMIN PO) Take by  mouth daily.  . rosuvastatin (CRESTOR) 10 MG tablet Take 10 mg by mouth daily.  . [DISCONTINUED] clindamycin (CLEOCIN) 300 MG capsule Take 1 capsule (300 mg total) by mouth 4 (four) times daily. X 7 days (Patient not taking: Reported on 09/26/2017)  . [DISCONTINUED] FLUCELVAX QUADRIVALENT 0.5 ML injection   . [DISCONTINUED] MOVIPREP 100 G SOLR movi prep as directed (Patient not taking: Reported on 09/26/2017)   No facility-administered encounter medications on file as of 10/16/2020.    REVIEW OF SYSTEMS  : All other systems reviewed and negative except where noted in the History of Present Illness.   PHYSICAL EXAM: BP 102/68   Pulse 74   Ht 5' 7.5" (1.715 m)   Wt 181 lb (82.1 kg)   BMI 27.93 kg/m  General: Well developed white male in no acute distress Head: Normocephalic and atraumatic Eyes:  Sclerae anicteric, conjunctiva pink. Ears: Normal auditory acuity Lungs: Clear throughout to auscultation; no W/R/R. Heart: Regular rate and rhythm; no M/R/G. Abdomen: Soft, non-distended.  BS present.  Non-tender. Rectal:  Will be done at the time of colonoscopy. Musculoskeletal: Symmetrical with no gross deformities  Skin: No lesions on visible extremities Extremities: No edema  Neurological: Alert oriented x 4, grossly non-focal Psychological:  Alert and cooperative. Normal mood and affect  ASSESSMENT AND PLAN: *Positive Hemosure/fecal immunochemistry testing:  No overt sign of GI bleeding.  Hemoglobin normal.  No GI complaints.  We will plan for colonoscopy with Dr. Ardis Hughs.  Last colonoscopy was May 2013.  The risks, benefits, and alternatives to colonoscopy were discussed with the patient and he consents to proceed.    CC:  Marton Redwood, MD

## 2020-10-16 NOTE — Progress Notes (Signed)
I agree with the above note, plan. He is being over screened for colon cancer however now we have this abnormal test result and should proceed with a colonoscopy.

## 2020-10-16 NOTE — Patient Instructions (Signed)
If you are age 61 or older, your body mass index should be between 23-30. Your Body mass index is 27.93 kg/m. If this is out of the aforementioned range listed, please consider follow up with your Primary Care Provider.  If you are age 77 or younger, your body mass index should be between 19-25. Your Body mass index is 27.93 kg/m. If this is out of the aformentioned range listed, please consider follow up with your Primary Care Provider.   You have been scheduled for a colonoscopy. Please follow written instructions given to you at your visit today.  Please pick up your prep supplies at the pharmacy within the next 1-3 days. If you use inhalers (even only as needed), please bring them with you on the day of your procedure.  Thank you for choosing me and [facility].  Alonza Bogus , PA-C

## 2020-10-21 ENCOUNTER — Encounter: Payer: Self-pay | Admitting: Gastroenterology

## 2020-10-27 ENCOUNTER — Other Ambulatory Visit: Payer: Self-pay

## 2020-10-27 ENCOUNTER — Ambulatory Visit (AMBULATORY_SURGERY_CENTER): Payer: 59 | Admitting: Gastroenterology

## 2020-10-27 ENCOUNTER — Encounter: Payer: Self-pay | Admitting: Gastroenterology

## 2020-10-27 VITALS — BP 98/49 | HR 61 | Temp 97.8°F | Resp 15 | Ht 69.0 in | Wt 181.0 lb

## 2020-10-27 DIAGNOSIS — K5730 Diverticulosis of large intestine without perforation or abscess without bleeding: Secondary | ICD-10-CM | POA: Diagnosis not present

## 2020-10-27 DIAGNOSIS — Z1211 Encounter for screening for malignant neoplasm of colon: Secondary | ICD-10-CM | POA: Diagnosis not present

## 2020-10-27 DIAGNOSIS — R195 Other fecal abnormalities: Secondary | ICD-10-CM

## 2020-10-27 MED ORDER — SODIUM CHLORIDE 0.9 % IV SOLN: 500.0000 mL | Freq: Once | INTRAVENOUS | Status: DC

## 2020-10-27 NOTE — Progress Notes (Signed)
Pt's states no medical or surgical changes since previsit or office visit.  CW - vitals 

## 2020-10-27 NOTE — Op Note (Signed)
[facility] Patient Name: Clarence Jordan Procedure Date: 10/27/2020 9:06 AM MRN: [id number] Endoscopist: Milus Banister , MD Age: 60 Referring MD:  Date of Birth: Apr 06, 1961 Gender: Male Account #: [id number] Procedure:                Colonoscopy Indications:              Positive fecal immunochemical test (PCP);                            Colonoscopy 2013 no polyps or cancers. Medicines:                Monitored Anesthesia Care Procedure:                Pre-Anesthesia Assessment:                           - Prior to the procedure, a History and Physical                            was performed, and patient medications and                            allergies were reviewed. The patient's tolerance of                            previous anesthesia was also reviewed. The risks                            and benefits of the procedure and the sedation                            options and risks were discussed with the patient.                            All questions were answered, and informed consent                            was obtained. Prior Anticoagulants: The patient has                            taken no previous anticoagulant or antiplatelet                            agents. ASA Grade Assessment: II - A patient with                            mild systemic disease. After reviewing the risks                            and benefits, the patient was deemed in                            satisfactory condition to undergo the procedure.  After obtaining informed consent, the colonoscope                            was passed under direct vision. Throughout the                            procedure, the patient's blood pressure, pulse, and                            oxygen saturations were monitored continuously. The                            Olympus CF-HQ190 [id number]) Colonoscope was                            introduced through the anus and  advanced to the the                            cecum, identified by appendiceal orifice and                            ileocecal valve. The colonoscopy was performed                            without difficulty. The patient tolerated the                            procedure well. The quality of the bowel                            preparation was good. The ileocecal valve,                            appendiceal orifice, and rectum were photographed. Scope In: 9:11:12 AM Scope Out: 9:23:54 AM Scope Withdrawal Time: 0 hours 8 minutes 0 seconds  Total Procedure Duration: 0 hours 12 minutes 42 seconds  Findings:                 A few small and large-mouthed diverticula were                            found in the left colon.                           The exam was otherwise without abnormality on                            direct and retroflexion views. Complications:            No immediate complications. Estimated blood loss:                            None. Estimated Blood Loss:     Estimated blood loss: none. Impression:               - Diverticulosis in the left  colon.                           - The examination was otherwise normal on direct                            and retroflexion views.                           - No polyps or cancers. Recommendation:           - Patient has a contact number available for                            emergencies. The signs and symptoms of potential                            delayed complications were discussed with the                            patient. Return to normal activities tomorrow.                            Written discharge instructions were provided to the                            patient.                           - Resume previous diet.                           - Continue present medications.                           - Repeat colonoscopy in 10 years for screening.                            There is no need for colon cancer  screening by any                            method (including stool testing) prior to then. Rachael Fee, MD 10/27/2020 9:26:36 AM This report has been signed electronically.

## 2020-10-27 NOTE — Patient Instructions (Signed)
YOU HAD AN ENDOSCOPIC PROCEDURE TODAY AT THE [facility]:   Refer to the procedure report that was given to you for any specific questions about what was found during the examination.  If the procedure report does not answer your questions, please call your gastroenterologist to clarify.  If you requested that your care partner not be given the details of your procedure findings, then the procedure report has been included in a sealed envelope for you to review at your convenience later.  YOU SHOULD EXPECT: Some feelings of bloating in the abdomen. Passage of more gas than usual.  Walking can help get rid of the air that was put into your GI tract during the procedure and reduce the bloating. If you had a lower endoscopy (such as a colonoscopy or flexible sigmoidoscopy) you may notice spotting of blood in your stool or on the toilet paper. If you underwent a bowel prep for your procedure, you may not have a normal bowel movement for a few days.  Please Note:  You might notice some irritation and congestion in your nose or some drainage.  This is from the oxygen used during your procedure.  There is no need for concern and it should clear up in a day or so.  SYMPTOMS TO REPORT IMMEDIATELY:   Following lower endoscopy (colonoscopy or flexible sigmoidoscopy):  Excessive amounts of blood in the stool  Significant tenderness or worsening of abdominal pains  Swelling of the abdomen that is new, acute  Fever of 100F or higher  For urgent or emergent issues, a gastroenterologist can be reached at any hour by calling [phone]. Do not use MyChart messaging for urgent concerns.    DIET:  We do recommend a small meal at first, but then you may proceed to your regular diet.  Drink plenty of fluids but you should avoid alcoholic beverages for 24 hours.  ACTIVITY:  You should plan to take it easy for the rest of today and you should NOT DRIVE or use heavy machinery until tomorrow (because  of the sedation medicines used during the test).    FOLLOW UP: Our staff will call the number listed on your records 48-72 hours following your procedure to check on you and address any questions or concerns that you may have regarding the information given to you following your procedure. If we do not reach you, we will leave a message.  We will attempt to reach you two times.  During this call, we will ask if you have developed any symptoms of COVID 19. If you develop any symptoms (ie: fever, flu-like symptoms, shortness of breath, cough etc.) before then, please call [phone].  If you test positive for Covid 19 in the 2 weeks post procedure, please call and report this information to us.    If any biopsies were taken you will be contacted by phone or by letter within the next 1-3 weeks.  Please call us at [phone] if you have not heard about the biopsies in 3 weeks.    SIGNATURES/CONFIDENTIALITY: You and/or your care partner have signed paperwork which will be entered into your electronic medical record.  These signatures attest to the fact that that the information above on your After Visit Summary has been reviewed and is understood.  Full responsibility of the confidentiality of this discharge information lies with you and/or your care-partner. 

## 2020-10-29 ENCOUNTER — Telehealth: Payer: Self-pay | Admitting: *Deleted

## 2020-10-29 NOTE — Telephone Encounter (Signed)
  Follow up Call-  Call back number 10/27/2020  Post procedure Call Back phone  # [phone]  Permission to leave phone message Yes  Some recent data might be hidden     Patient questions:  Do you have a fever, pain , or abdominal swelling? No. Pain Score  0 *  Have you tolerated food without any problems? Yes.    Have you been able to return to your normal activities? Yes.    Do you have any questions about your discharge instructions: Diet   No. Medications  No. Follow up visit  No.  Do you have questions or concerns about your Care? No.  Actions: * If pain score is 4 or above: No action needed, pain <4.  1. Have you developed a fever since your procedure? no  2.   Have you had an respiratory symptoms (SOB or cough) since your procedure? no  3.   Have you tested positive for COVID 19 since your procedure no  4.   Have you had any family members/close contacts diagnosed with the COVID 19 since your procedure?  no   If yes to any of these questions please route to Joylene John, RN and Joella Prince, RN

## 2021-01-13 ENCOUNTER — Other Ambulatory Visit (HOSPITAL_BASED_OUTPATIENT_CLINIC_OR_DEPARTMENT_OTHER): Payer: Self-pay

## 2021-01-13 MED ORDER — METRONIDAZOLE 500 MG PO TABS
ORAL_TABLET | ORAL | 0 refills | Status: AC
  Filled 2021-01-13 (×2): qty 42, 14d supply, fill #0

## 2021-01-13 MED ORDER — CIPROFLOXACIN HCL 500 MG PO TABS
ORAL_TABLET | ORAL | 0 refills | Status: AC
  Filled 2021-01-13: qty 28, 14d supply, fill #0

## 2021-01-13 MED ORDER — METRONIDAZOLE 500 MG PO TABS
ORAL_TABLET | ORAL | 0 refills | Status: AC
  Filled 2021-01-13: qty 30, 10d supply, fill #0

## 2021-02-24 MED FILL — Rosuvastatin Calcium Tab 10 MG: ORAL | 30 days supply | Qty: 30 | Fill #0 | Status: AC

## 2021-02-25 ENCOUNTER — Other Ambulatory Visit (HOSPITAL_BASED_OUTPATIENT_CLINIC_OR_DEPARTMENT_OTHER): Payer: Self-pay

## 2021-03-06 ENCOUNTER — Other Ambulatory Visit (HOSPITAL_BASED_OUTPATIENT_CLINIC_OR_DEPARTMENT_OTHER): Payer: Self-pay

## 2021-03-31 ENCOUNTER — Other Ambulatory Visit (HOSPITAL_BASED_OUTPATIENT_CLINIC_OR_DEPARTMENT_OTHER): Payer: Self-pay

## 2021-03-31 MED FILL — Rosuvastatin Calcium Tab 10 MG: ORAL | 30 days supply | Qty: 30 | Fill #1 | Status: AC

## 2021-04-14 DIAGNOSIS — H524 Presbyopia: Secondary | ICD-10-CM | POA: Diagnosis not present

## 2021-05-03 MED FILL — Rosuvastatin Calcium Tab 10 MG: ORAL | 90 days supply | Qty: 90 | Fill #2 | Status: AC

## 2021-05-04 ENCOUNTER — Other Ambulatory Visit (HOSPITAL_BASED_OUTPATIENT_CLINIC_OR_DEPARTMENT_OTHER): Payer: Self-pay

## 2021-07-28 MED FILL — Rosuvastatin Calcium Tab 10 MG: ORAL | 60 days supply | Qty: 60 | Fill #3 | Status: AC

## 2021-07-29 ENCOUNTER — Other Ambulatory Visit (HOSPITAL_BASED_OUTPATIENT_CLINIC_OR_DEPARTMENT_OTHER): Payer: Self-pay

## 2021-09-24 DIAGNOSIS — Z125 Encounter for screening for malignant neoplasm of prostate: Secondary | ICD-10-CM | POA: Diagnosis not present

## 2021-09-24 DIAGNOSIS — E785 Hyperlipidemia, unspecified: Secondary | ICD-10-CM | POA: Diagnosis not present

## 2021-10-02 ENCOUNTER — Other Ambulatory Visit (HOSPITAL_BASED_OUTPATIENT_CLINIC_OR_DEPARTMENT_OTHER): Payer: Self-pay

## 2021-10-02 DIAGNOSIS — M5416 Radiculopathy, lumbar region: Secondary | ICD-10-CM | POA: Diagnosis not present

## 2021-10-02 DIAGNOSIS — Z1331 Encounter for screening for depression: Secondary | ICD-10-CM | POA: Diagnosis not present

## 2021-10-02 DIAGNOSIS — Z1339 Encounter for screening examination for other mental health and behavioral disorders: Secondary | ICD-10-CM | POA: Diagnosis not present

## 2021-10-02 DIAGNOSIS — E785 Hyperlipidemia, unspecified: Secondary | ICD-10-CM | POA: Diagnosis not present

## 2021-10-02 DIAGNOSIS — Z0000 Encounter for general adult medical examination without abnormal findings: Secondary | ICD-10-CM | POA: Diagnosis not present

## 2021-10-02 DIAGNOSIS — R7301 Impaired fasting glucose: Secondary | ICD-10-CM | POA: Diagnosis not present

## 2021-10-02 DIAGNOSIS — M19041 Primary osteoarthritis, right hand: Secondary | ICD-10-CM | POA: Diagnosis not present

## 2021-10-02 DIAGNOSIS — G4700 Insomnia, unspecified: Secondary | ICD-10-CM | POA: Diagnosis not present

## 2021-10-02 DIAGNOSIS — M4310 Spondylolisthesis, site unspecified: Secondary | ICD-10-CM | POA: Diagnosis not present

## 2021-10-02 DIAGNOSIS — R82998 Other abnormal findings in urine: Secondary | ICD-10-CM | POA: Diagnosis not present

## 2021-10-02 MED ORDER — EZETIMIBE 10 MG PO TABS
ORAL_TABLET | ORAL | 3 refills | Status: DC
  Filled 2021-10-02: qty 90, 90d supply, fill #0
  Filled 2021-12-24: qty 90, 90d supply, fill #1
  Filled 2022-04-08: qty 90, 90d supply, fill #2
  Filled 2022-07-04: qty 90, 90d supply, fill #3

## 2021-10-02 MED ORDER — ROSUVASTATIN CALCIUM 10 MG PO TABS
ORAL_TABLET | ORAL | 3 refills | Status: DC
  Filled 2021-10-02: qty 90, 90d supply, fill #0
  Filled 2021-12-24: qty 90, 90d supply, fill #1
  Filled 2022-04-08: qty 90, 90d supply, fill #2
  Filled 2022-07-04: qty 90, 90d supply, fill #3

## 2021-12-25 ENCOUNTER — Other Ambulatory Visit (HOSPITAL_BASED_OUTPATIENT_CLINIC_OR_DEPARTMENT_OTHER): Payer: Self-pay

## 2022-01-26 IMAGING — CT CT CARDIAC CORONARY ARTERY CALCIUM SCORE
3 series · 14 of 20 positions shown, 16 images · non-contrast
Comparison: None

CLINICAL DATA: Hyperlipidemia

EXAM:
CT CARDIAC CORONARY ARTERY CALCIUM SCORE
TECHNIQUE: Non-contrast imaging through the heart was performed using
prospective ECG gating. Image post processing was performed on an
independent workstation, allowing for quantitative analysis of the
heart and coronary arteries. Note that this exam targets the heart
and the chest was not imaged in its entirety.

[Series 2: calcium scoring 2.00 qr36 bestdiast 69% hrt calciu · axial · 0.37mm/px · z∈[+1667,+1751]mm · 4 of 70 slices shown]
[im 14/70  vessel]
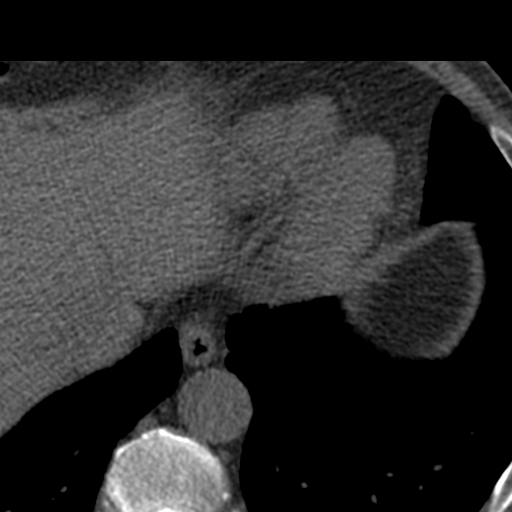
[im 28/70  vessel]
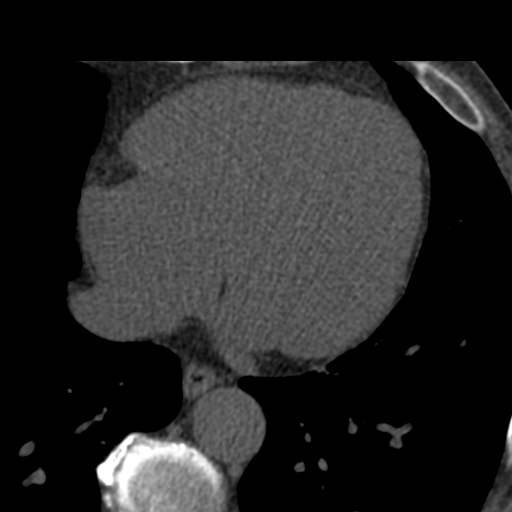
[im 42/70  vessel]
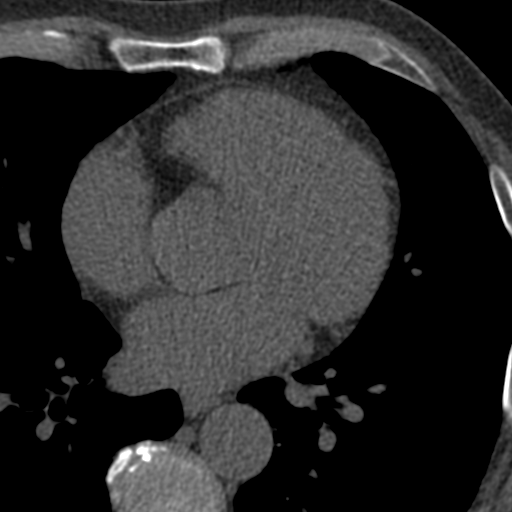
[im 56/70  vessel]
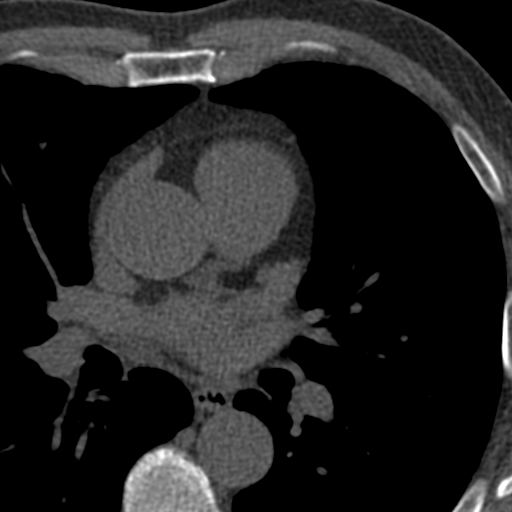

[Series 3: calcium scoring 2.00 br40 bestdiast 69% axial · axial · 0.54mm/px · z∈[+1663,+1755]mm · 5 of 70 slices shown, 7 images]
[im 12/70  vessel]
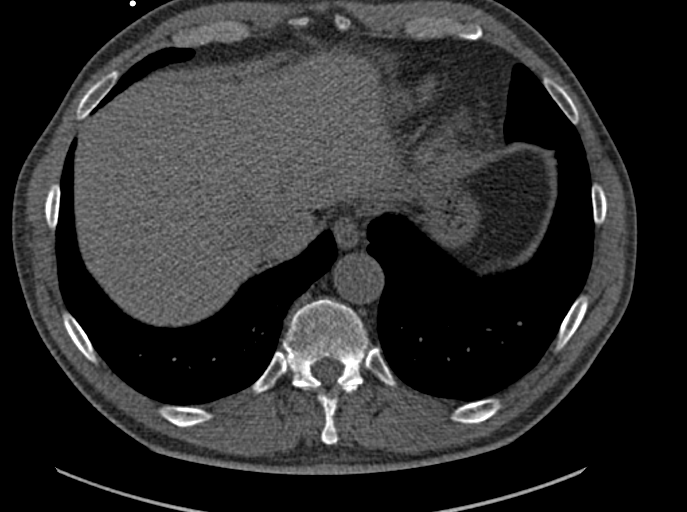
[im 12/70  lung]
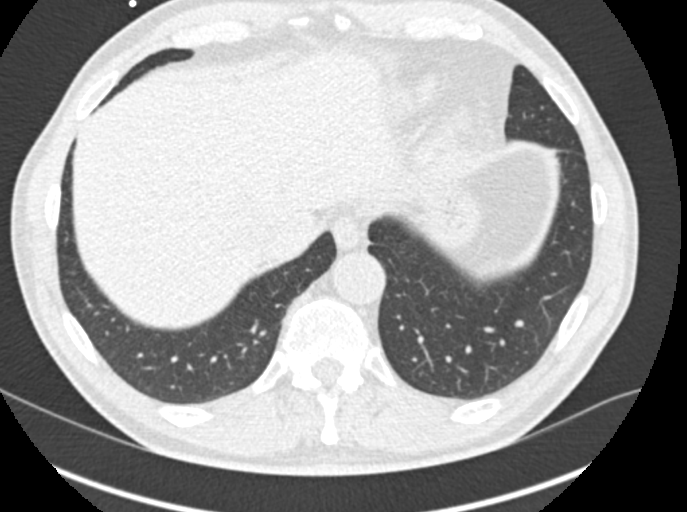
[im 24/70  vessel]
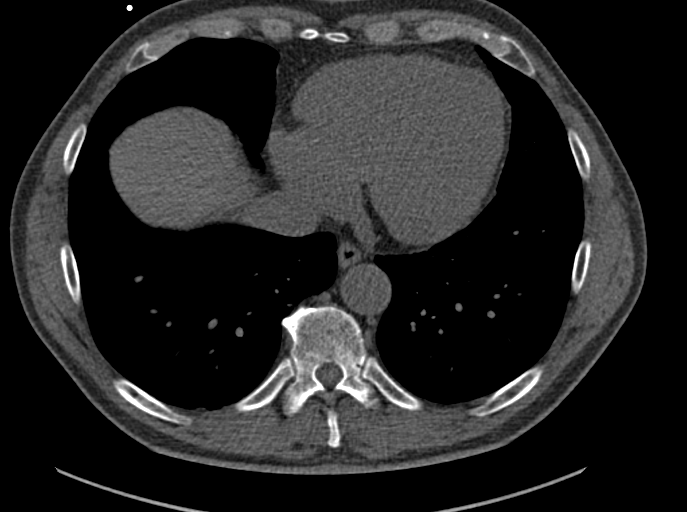
[im 35/70  vessel]
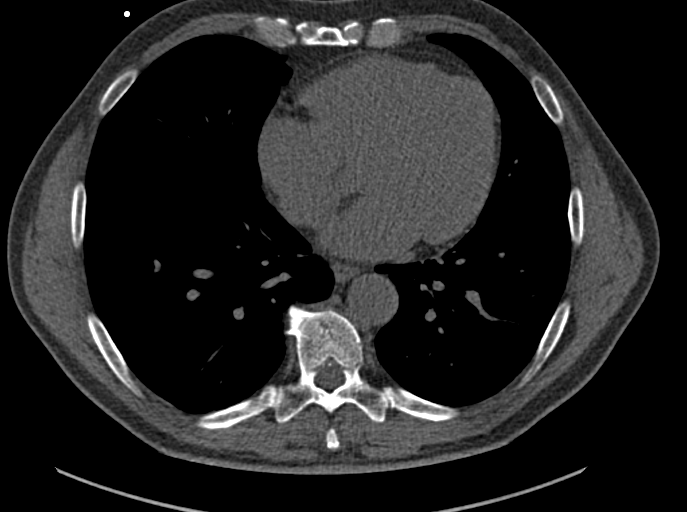
[im 47/70  vessel]
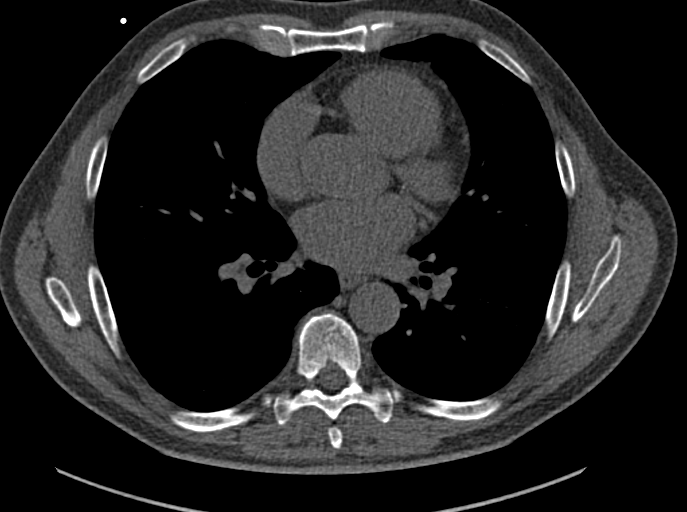
[im 58/70  vessel]
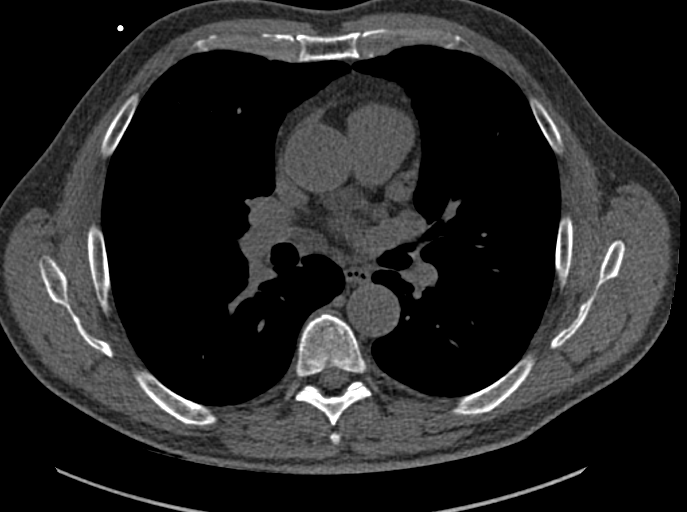
[im 58/70  lung]
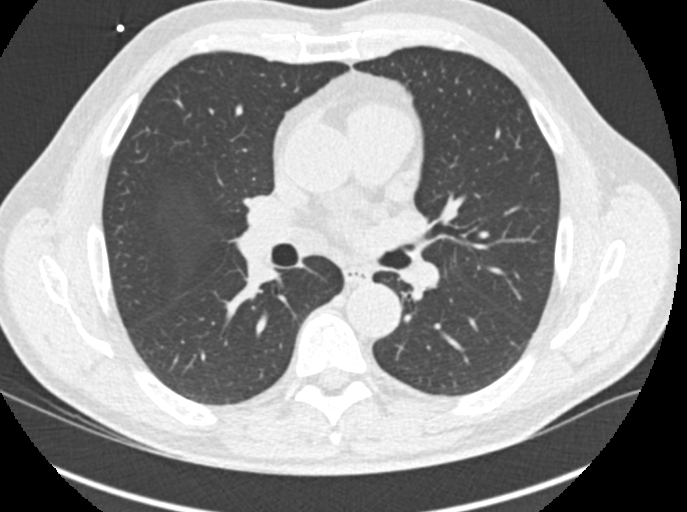

[Series 9: calcium scoring 2.00 br60 bestdiast 69% lungs · axial · 0.54mm/px · z∈[+1663,+1755]mm · 5 of 70 slices shown]
[im 12/70  vessel]
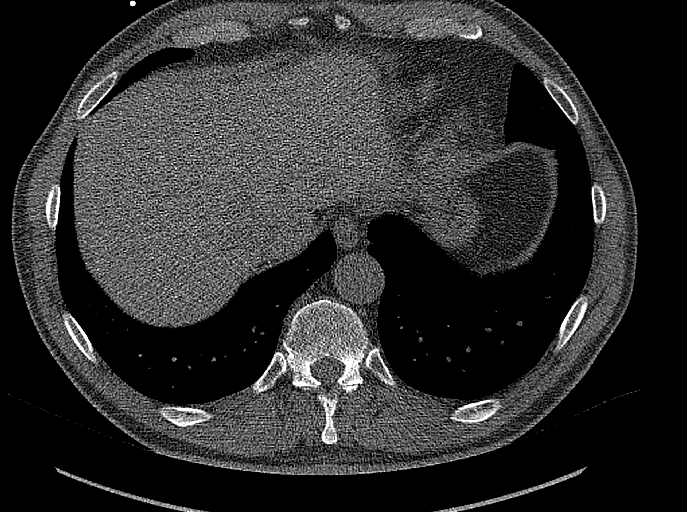
[im 24/70  vessel]
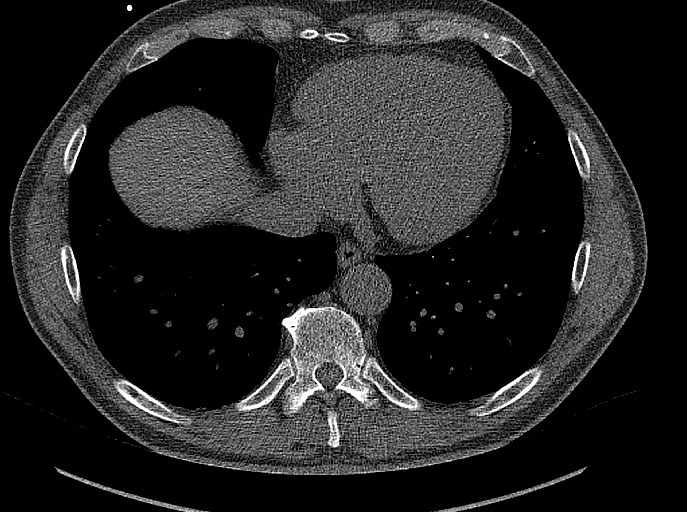
[im 35/70  vessel]
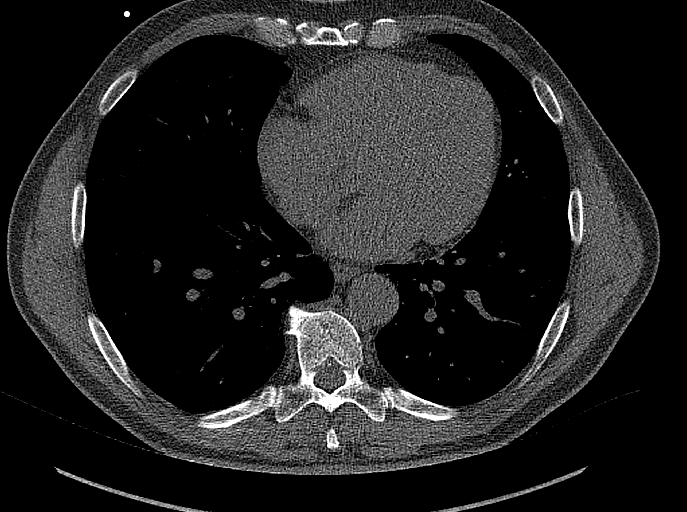
[im 47/70  vessel]
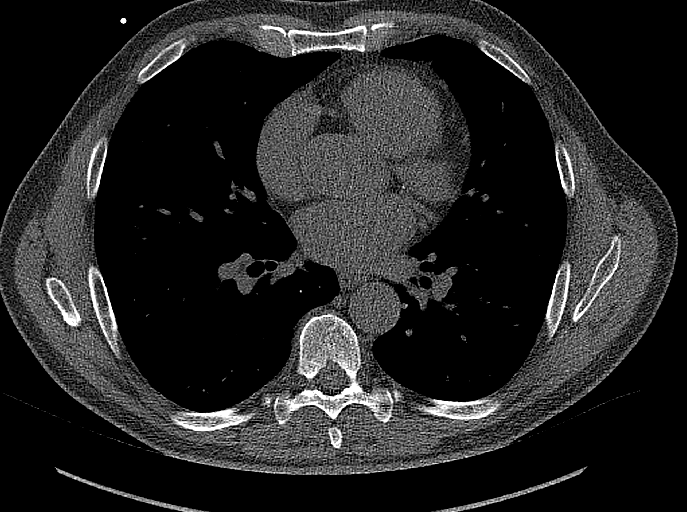
[im 58/70  vessel]
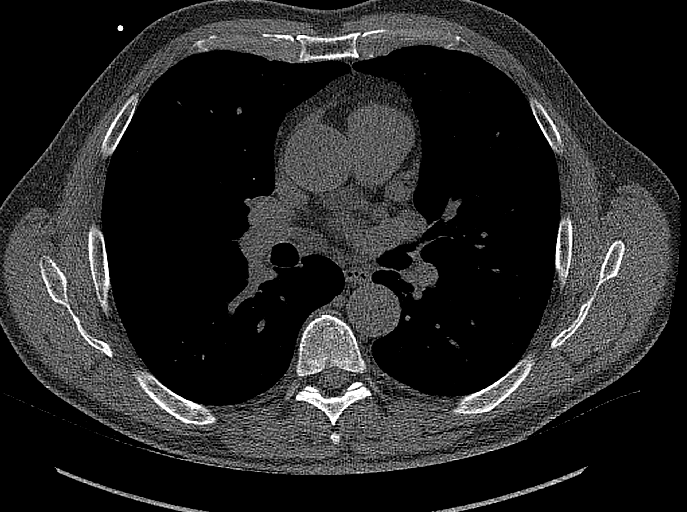

[14 of 20 positions shown; findings below may reference images not displayed]

FINDINGS: CORONARY CALCIUM SCORES:

Left Main: 0

LAD: 153

LCx: 0

RCA: 0

Total Agatston Score: 153

[HOSPITAL] percentile: 74

AORTA MEASUREMENTS:

Ascending Aorta: 35 mm

Descending Aorta: 28 mm

OTHER FINDINGS:

Heart is normal size. Aorta normal caliber. Calcifications in the
aortic root. No adenopathy. No confluent opacities or effusions.
Imaging into the upper abdomen demonstrates no acute findings. Chest
wall soft tissues are unremarkable. No acute bony abnormality.
IMPRESSION: The observed calcium score of 153 is at the percentile 74 for
subjects of the same age, gender and race/ethnicity who are free of
clinical cardiovascular disease and treated diabetes.

No acute or significant extracardiac abnormality

No acute or significant extracardiac abnormality.

## 2022-04-09 ENCOUNTER — Other Ambulatory Visit (HOSPITAL_BASED_OUTPATIENT_CLINIC_OR_DEPARTMENT_OTHER): Payer: Self-pay

## 2022-07-05 ENCOUNTER — Other Ambulatory Visit (HOSPITAL_BASED_OUTPATIENT_CLINIC_OR_DEPARTMENT_OTHER): Payer: Self-pay

## 2022-10-01 DIAGNOSIS — R7301 Impaired fasting glucose: Secondary | ICD-10-CM | POA: Diagnosis not present

## 2022-10-01 DIAGNOSIS — E785 Hyperlipidemia, unspecified: Secondary | ICD-10-CM | POA: Diagnosis not present

## 2022-10-01 DIAGNOSIS — Z125 Encounter for screening for malignant neoplasm of prostate: Secondary | ICD-10-CM | POA: Diagnosis not present

## 2022-10-04 DIAGNOSIS — R82998 Other abnormal findings in urine: Secondary | ICD-10-CM | POA: Diagnosis not present

## 2022-10-10 ENCOUNTER — Other Ambulatory Visit (HOSPITAL_BASED_OUTPATIENT_CLINIC_OR_DEPARTMENT_OTHER): Payer: Self-pay

## 2022-10-11 ENCOUNTER — Other Ambulatory Visit (HOSPITAL_BASED_OUTPATIENT_CLINIC_OR_DEPARTMENT_OTHER): Payer: Self-pay

## 2022-10-11 ENCOUNTER — Other Ambulatory Visit: Payer: Self-pay

## 2022-10-11 MED ORDER — EZETIMIBE 10 MG PO TABS
10.0000 mg | ORAL_TABLET | Freq: Every day | ORAL | 3 refills | Status: AC
  Filled 2022-10-11: qty 90, 90d supply, fill #0
  Filled 2023-01-09: qty 90, 90d supply, fill #1
  Filled 2023-03-28: qty 90, 90d supply, fill #2
  Filled 2023-07-02: qty 90, 90d supply, fill #3

## 2022-10-11 MED ORDER — ROSUVASTATIN CALCIUM 10 MG PO TABS
10.0000 mg | ORAL_TABLET | Freq: Every day | ORAL | 3 refills | Status: AC
  Filled 2022-10-11: qty 90, 90d supply, fill #0
  Filled 2023-01-09: qty 90, 90d supply, fill #1
  Filled 2023-03-28: qty 90, 90d supply, fill #2
  Filled 2023-07-02: qty 90, 90d supply, fill #3

## 2022-10-13 ENCOUNTER — Other Ambulatory Visit: Payer: Self-pay

## 2022-10-13 ENCOUNTER — Ambulatory Visit: Payer: 59 | Attending: Internal Medicine | Admitting: Rehabilitative and Restorative Service Providers"

## 2022-10-13 DIAGNOSIS — M79662 Pain in left lower leg: Secondary | ICD-10-CM | POA: Insufficient documentation

## 2022-10-13 DIAGNOSIS — R195 Other fecal abnormalities: Secondary | ICD-10-CM | POA: Diagnosis not present

## 2022-10-13 DIAGNOSIS — M5416 Radiculopathy, lumbar region: Secondary | ICD-10-CM | POA: Diagnosis not present

## 2022-10-13 DIAGNOSIS — R293 Abnormal posture: Secondary | ICD-10-CM | POA: Diagnosis not present

## 2022-10-13 DIAGNOSIS — M62838 Other muscle spasm: Secondary | ICD-10-CM | POA: Insufficient documentation

## 2022-10-13 DIAGNOSIS — M5459 Other low back pain: Secondary | ICD-10-CM

## 2022-10-13 DIAGNOSIS — M79672 Pain in left foot: Secondary | ICD-10-CM | POA: Insufficient documentation

## 2022-10-13 DIAGNOSIS — M545 Low back pain, unspecified: Secondary | ICD-10-CM | POA: Insufficient documentation

## 2022-10-13 DIAGNOSIS — R29898 Other symptoms and signs involving the musculoskeletal system: Secondary | ICD-10-CM

## 2022-10-13 DIAGNOSIS — M25552 Pain in left hip: Secondary | ICD-10-CM | POA: Insufficient documentation

## 2022-10-13 NOTE — Therapy (Signed)
OUTPATIENT PHYSICAL THERAPY THORACOLUMBAR EVALUATION   Patient Name: Clarence Jordan MRN: [id number] DOB:1961/01/03, 62 y.o., male Today's Date: 10/13/2022  END OF SESSION:  PT End of Session - 10/13/22 1245     Visit Number 1    Number of Visits 16    Date for PT Re-Evaluation 12/08/22    Authorization Type [employer]    PT Start Time 1145    PT Stop Time 1235    PT Time Calculation (min) 50 min    Activity Tolerance Patient tolerated treatment well             Past Medical History:  Diagnosis Date   Hyperlipidemia    Kidney stones    Past Surgical History:  Procedure Laterality Date   fx orbital floor repair     rt.   INGUINAL HERNIA REPAIR     left   KNEE ARTHROSCOPY W/ MEDIAL COLLATERAL LIGAMENT (MCL) REPAIR     left   lasik surgury     O.U.   TONSILLECTOMY AND ADENOIDECTOMY     VASECTOMY     Patient Active Problem List   Diagnosis Date Noted   Positive fecal immunochemical test 10/16/2020   Inguinal hernia, right 02/06/2014   ANKLE PAIN, LEFT 11/16/2010    PCP: Dr Marton Redwood  REFERRING PROVIDER: Dr Marton Redwood  REFERRING DIAG: lumbar radiculopathy   Rationale for Evaluation and Treatment: Rehabilitation  THERAPY DIAG:  Radiculopathy, lumbar region  Other symptoms and signs involving the musculoskeletal system  Other low back pain  ONSET DATE: 01/25/22  SUBJECTIVE:                                                                                                                                                                                           SUBJECTIVE STATEMENT: Patient reports that he has had back problems for at least 3 decades. MRI showed spondylolisthesis. He has continued flare ups on an intermittent basis. He has had pain in the past 6-7 months with pain in the back and back of the Lt hip and into the Lt LE to foot. Pain is increased with sitting; standing; lying down and better with movement.   PERTINENT HISTORY:  Long  history of LBP; MCL repair Lt knee 1991; hernia surgery ~ 20 yrs ago; currently as Rt inguinal hernia; trigger finger long finger Rt hand  PAIN:  Are you having pain? Yes: NPRS scale: 5/10 Pain location: Lt LB; posterior Lt hip to LE to foot  Pain description: sharp; throbbing at times; numbness mostly in Lt foot  Aggravating factors: sitting; standing; lying down  Relieving factors: nothing  PRECAUTIONS: None  WEIGHT BEARING RESTRICTIONS: No  FALLS:  Has patient fallen in last 6 months? 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  [employer] work; reading - recliner  Sleeps prone   PLOF: Independent  PATIENT GOALS: get rid of the pain and prevent it from coming back   NEXT MD VISIT: as needed   OBJECTIVE:   DIAGNOSTIC FINDINGS:  None available for current episode of back pain   PATIENT SURVEYS:  FOTO 48; goal 36  COGNITION: Overall cognitive status: Within functional limits for tasks assessed     SENSATION: Intermittent numbness in the Lt foot; sometimes Lt LE   MUSCLE LENGTH: Hamstrings: Right 75 deg; Left 70 deg   POSTURE: rounded shoulders, decreased lumbar lordosis, increased thoracic kyphosis, and flexed trunk   PALPATION: No pain with PA and lateral mobs lumbar spine  Muscular tightness Lt posterior hip through the piriformis/gluts  LUMBAR ROM:   AROM eval  Flexion 85%  Extension 45%  Right lateral flexion 80%  Left lateral flexion 80%  Right rotation 60%  Left rotation 60%   (Blank rows = not tested)  LOWER EXTREMITY ROM:     Tight rotation Lt > Rt hips    LOWER EXTREMITY MMT:    LE strength WNL's throughout   MMT Right eval Left eval  Hip flexion    Hip extension     Hip abduction    Hip adduction    Hip internal rotation    Hip external rotation    Knee flexion    Knee extension    Ankle dorsiflexion    Ankle plantarflexion    Ankle inversion    Ankle eversion     (Blank rows = not tested)  LUMBAR SPECIAL TESTS:  Straight leg raise test: Negative Slump test: increased pain Lt posterior hip area no radicular changes   GAIT: WFL''s   TODAY'S TREATMENT:                                                                                                                              [facility] Adult PT Treatment:                                                DATE: 10/13/22 Therapeutic Exercise: Prone press up 2 sec x 5 Prone prop ~ 1 min  Piriformis stretch supine travell 30 sec x 3  Diagonal knee to chest 30 sec x 3  Transverse abdominal core activation 10 sec x 10 (difficult to coordinate transverse abdominals and multifidi) Standing  trunk extension 1-2 sec x 2  Manual Therapy: To schedule trial for DN Neuromuscular re-ed: Working on posture and alignment in standing and sitting  Self Care: Use of pool noodle to improve sitting posture  To modify recliner for home     PATIENT EDUCATION:  Education details: POC; HEP  Person educated: Patient Education method: Consulting civil engineer, Demonstration, Corporate treasurer cues, Verbal cues, and Handouts Education comprehension: verbalized understanding, returned demonstration, verbal cues required, tactile cues required, and needs further education  HOME EXERCISE PROGRAM: Access Code: [email or web address] URL: [email or web address] Date: 10/13/2022 Prepared by: Gillermo Murdoch  Exercises - Prone Press Up  - 2 x daily - 7 x weekly - 1 sets - 10 reps - 2-3 sec  hold - Prone Press Up On Elbows  - 2 x daily - 7 x weekly - 1 sets - 3 reps - 30 sec  hold - Standing Lumbar Extension  - 2 x daily - 7 x weekly - 1 sets - 2-3 reps - 2-3 sec  hold - Supine Piriformis Stretch with Leg Straight  - 2 x daily - 7 x weekly - 1 sets -  3 reps - 30 sec  hold - Supine Transversus Abdominis Bracing with Pelvic Floor Contraction  - 2 x daily - 7 x weekly - 1 sets - 10 reps - 10sec  hold  Patient Education - Trigger Point Dry Needling - TENS Unit - Office Posture - Posture and Body Mechanics  ASSESSMENT:  CLINICAL IMPRESSION: Patient is a 62 y.o. male who was seen today for physical therapy evaluation and treatment for low back pain and Lt LE numbness and pain posterior hip to foot. Patient states that this episode of pain is not like prior episodes of back pain. He has not had the LE radicular symptoms in the past. Patient presents witih rounded posture and demonstrates sitting and standing postures with decreased lumbar lordosis; limited trunk and LE mobility; muscular tightness to palpation in the Lt posterior hip - piriformis and gluts. Radicular symptoms LT LE may be related to posture as well as muscular tightness in the Lt posterior hip. He will benefit from PT to address problems identified.   OBJECTIVE IMPAIRMENTS: hypomobility, increased fascial restrictions, increased muscle spasms, impaired flexibility, impaired sensation, improper body mechanics, postural dysfunction, and pain.   ACTIVITY LIMITATIONS: sitting, standing, and sleeping  PARTICIPATION LIMITATIONS: occupation and yard work  PERSONAL FACTORS: Behavior pattern, Past/current experiences, Time since onset of injury/illness/exacerbation, and comorbidities: including recurrent back pain x 30 years; Lt MCL repair ~ 30 yrs inguinal hernia repair and current inguinal hernia ago are also affecting patient's functional outcome.   REHAB POTENTIAL: Good  CLINICAL DECISION MAKING: Stable/uncomplicated  EVALUATION COMPLEXITY: Low   GOALS: Goals reviewed with patient? Yes  SHORT TERM GOALS: Target date: 11/10/2022  Independent in initial HEP Baseline: Goal status: INITIAL  2.  Patient demonstrates improved sitting and standing posture and alignment   Baseline:  Goal status: INITIAL   LONG TERM GOALS: Target date: 12/08/2022   Increase bilat hip mobility in rotation  Baseline:  Goal status: INITIAL  2.  Decrease radicular Lt LE symptoms by 75-100% Baseline:  Goal status: INITIAL  3.  Improve core strength with patient to tolerate 30 min of exercise without increased radicular symptoms  Baseline:  Goal status: INITIAL  4.  Patient demonstrates and verbalizes good body mechanics and back care principles  Baseline:  Goal status: INITIAL  5.  Independent in HEP  Baseline:  Goal status: INITIAL  6.  Improve functional limitation score to 62 Baseline:  Goal status: INITIAL  PLAN:  PT FREQUENCY: 2x/week  PT DURATION: 8 weeks  PLANNED INTERVENTIONS: Therapeutic exercises, Therapeutic activity, Neuromuscular re-education, Balance training, Gait training, Patient/Family education, Self Care, Joint mobilization, Aquatic Therapy, Dry Needling, Electrical stimulation, Spinal mobilization, Cryotherapy, Moist heat, Taping, Traction, Ultrasound, Ionotophoresis '4mg'$ /ml Dexamethasone, Manual therapy, and Re-evaluation.  PLAN FOR NEXT SESSION: review and progress exercises; manual work, DN, modalities as indicated - trial of DN Lt posterior hip    Gaje Tennyson Nilda Simmer, PT 10/13/2022, 12:47 PM

## 2022-10-14 ENCOUNTER — Ambulatory Visit: Payer: 59 | Admitting: Rehabilitative and Restorative Service Providers"

## 2022-10-14 ENCOUNTER — Encounter: Payer: Self-pay | Admitting: Rehabilitative and Restorative Service Providers"

## 2022-10-14 DIAGNOSIS — M5459 Other low back pain: Secondary | ICD-10-CM

## 2022-10-14 DIAGNOSIS — M545 Low back pain, unspecified: Secondary | ICD-10-CM | POA: Diagnosis not present

## 2022-10-14 DIAGNOSIS — M25552 Pain in left hip: Secondary | ICD-10-CM | POA: Diagnosis not present

## 2022-10-14 DIAGNOSIS — R293 Abnormal posture: Secondary | ICD-10-CM | POA: Diagnosis not present

## 2022-10-14 DIAGNOSIS — M62838 Other muscle spasm: Secondary | ICD-10-CM | POA: Diagnosis not present

## 2022-10-14 DIAGNOSIS — R29898 Other symptoms and signs involving the musculoskeletal system: Secondary | ICD-10-CM

## 2022-10-14 DIAGNOSIS — M79672 Pain in left foot: Secondary | ICD-10-CM | POA: Diagnosis not present

## 2022-10-14 DIAGNOSIS — M5416 Radiculopathy, lumbar region: Secondary | ICD-10-CM | POA: Diagnosis not present

## 2022-10-14 DIAGNOSIS — R195 Other fecal abnormalities: Secondary | ICD-10-CM | POA: Diagnosis not present

## 2022-10-14 DIAGNOSIS — M79662 Pain in left lower leg: Secondary | ICD-10-CM | POA: Diagnosis not present

## 2022-10-14 NOTE — Therapy (Signed)
OUTPATIENT PHYSICAL THERAPY THORACOLUMBAR TREATMENT   Patient Name: Clarence Jordan MRN: [id number] DOB:1961-02-08, 62 y.o., male Today's Date: 10/14/2022  END OF SESSION:  PT End of Session - 10/14/22 1154     Visit Number 2    Number of Visits 16    Date for PT Re-Evaluation 12/08/22    PT Start Time 5885    PT Stop Time 1230    PT Time Calculation (min) 45 min    Activity Tolerance Patient tolerated treatment well             Past Medical History:  Diagnosis Date   Hyperlipidemia    Kidney stones    Past Surgical History:  Procedure Laterality Date   fx orbital floor repair     rt.   INGUINAL HERNIA REPAIR     left   KNEE ARTHROSCOPY W/ MEDIAL COLLATERAL LIGAMENT (MCL) REPAIR     left   lasik surgury     O.U.   TONSILLECTOMY AND ADENOIDECTOMY     VASECTOMY     Patient Active Problem List   Diagnosis Date Noted   Positive fecal immunochemical test 10/16/2020   Inguinal hernia, right 02/06/2014   ANKLE PAIN, LEFT 11/16/2010    PCP: Dr Marton Redwood  REFERRING PROVIDER: Dr Marton Redwood  REFERRING DIAG: lumbar radiculopathy   Rationale for Evaluation and Treatment: Rehabilitation  THERAPY DIAG:  Radiculopathy, lumbar region  Other symptoms and signs involving the musculoskeletal system  Other low back pain  ONSET DATE: 01/25/22  SUBJECTIVE:                                                                                                                                                                                           SUBJECTIVE STATEMENT: Back felt better after treatment yesterday. Awoke with pain and stiffness but that improved after he did his stretches this morning. Came to work sitting and pain is again at 5/10   PERTINENT HISTORY:  Patient reports that he has had back problems for at least 3 decades. MRI showed spondylolisthesis. He has continued flare ups on an intermittent basis. He has had pain in the past 6-7 months with pain in the  back and back of the Lt hip and into the Lt LE to foot. Pain is increased with sitting; standing; lying down and better with movement ; MCL repair Lt knee 1991; hernia surgery ~ 20 yrs ago; currently as Rt inguinal hernia; trigger finger long finger Rt hand  PAIN:  Are you having pain? Yes: NPRS scale: 5/10 Pain location: Lt LB; posterior Lt hip to LE to foot  Pain description:  sharp; throbbing at times; numbness mostly in Lt foot  Aggravating factors: sitting; standing; lying down  Relieving factors: nothing   PRECAUTIONS: None  WEIGHT BEARING RESTRICTIONS: No  FALLS:  Has patient fallen in last 6 months? 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  [employer] work; reading - recliner  Sleeps prone   PLOF: Independent  PATIENT GOALS: get rid of the pain and prevent it from coming back   NEXT MD VISIT: as needed   OBJECTIVE:   DIAGNOSTIC FINDINGS:  None available for current episode of back pain   PATIENT SURVEYS:  FOTO 48; goal 10  COGNITION: Overall cognitive status: Within functional limits for tasks assessed     SENSATION: Intermittent numbness in the Lt foot; sometimes Lt LE   MUSCLE LENGTH: Hamstrings: Right 75 deg; Left 70 deg   POSTURE: rounded shoulders, decreased lumbar lordosis, increased thoracic kyphosis, and flexed trunk   PALPATION: No pain with PA and lateral mobs lumbar spine  Muscular tightness Lt posterior hip through the piriformis/gluts  LUMBAR ROM:   AROM eval  Flexion 85%  Extension 45%  Right lateral flexion 80%  Left lateral flexion 80%  Right rotation 60%  Left rotation 60%   (Blank rows = not tested)  LOWER EXTREMITY ROM:     Tight rotation Lt > Rt hips     LOWER EXTREMITY MMT:    LE strength WNL's throughout   MMT Right eval Left eval  Hip flexion    Hip extension    Hip abduction    Hip adduction    Hip internal rotation    Hip external rotation    Knee flexion    Knee extension    Ankle dorsiflexion    Ankle plantarflexion    Ankle inversion    Ankle eversion     (Blank rows = not tested)  LUMBAR SPECIAL TESTS:  Straight leg raise test: Negative Slump test: increased pain Lt posterior hip area no radicular changes   GAIT: WFL''s   TODAY'S TREATMENT:                                                                                                                              [facility] Adult PT Treatment:                                                 DATE: 10/14/22 Therapeutic Exercise: Treadmill 1.5 to 2.2 mph x 5 min  Prone press up 2 sec x 5 Prone prop ~ 1 min  Piriformis stretch  supine travell 30 sec x 3  Diagonal knee to chest 30 sec x 3  Transverse abdominal core activation 10 sec x 10 (difficult to coordinate transverse abdominals and multifidi) Standing trunk extension 1-2 sec x 2  Manual Therapy: Joint mobs lumbar spine Grade II/II PA mobs Skilled palpation to assess response to DN and manual work through lumbar spine/posterior Lt hip  Trigger Point Dry-Needling  Treatment instructions: Expect mild to moderate muscle soreness. S/S of pneumothorax if dry needled over a lung field, and to seek immediate medical attention should they occur. Patient verbalized understanding of these instructions and education.  Patient Consent Given: Yes Education handout provided: Previously provided Muscles treated: Lt glut min/max; piriformis  Electrical stimulation performed: Yes Parameters: mAmp current to pt tolerance  Treatment response/outcome: decreased palpable tightness   Kinesotaping - 1 strip either side of thoracolumbar spine w/ patient in slight trunk extension  Neuromuscular re-ed: Working on posture and  alignment in standing and sitting  Self Care: Use of pool noodle to improve sitting posture  To modify recliner for home   DATE: 10/13/22 Therapeutic Exercise: Prone press up 2 sec x 5 Prone prop ~ 1 min  Piriformis stretch supine travell 30 sec x 3  Diagonal knee to chest 30 sec x 3  Transverse abdominal core activation 10 sec x 10 (difficult to coordinate transverse abdominals and multifidi) Standing trunk extension 1-2 sec x 2  Manual Therapy: To schedule trial for DN Neuromuscular re-ed: Working on posture and alignment in standing and sitting  Self Care: Use of pool noodle to improve sitting posture  To modify recliner for home     PATIENT EDUCATION:  Education details: POC; HEP  Person educated: Patient Education method: Consulting civil engineer, Demonstration, Corporate treasurer cues, Verbal cues, and Handouts Education comprehension: verbalized understanding, returned demonstration, verbal cues required, tactile cues required, and needs further education  HOME EXERCISE PROGRAM: Access Code: [email or web address] URL: [email or web address] Date: 10/13/2022 Prepared by: Gillermo Murdoch  Exercises - Prone Press Up  - 2 x daily - 7 x weekly - 1 sets - 10 reps - 2-3 sec  hold - Prone Press Up On Elbows  - 2 x daily - 7 x weekly - 1 sets - 3 reps - 30 sec  hold - Standing Lumbar Extension  - 2 x daily - 7 x weekly - 1 sets - 2-3 reps - 2-3 sec  hold - Supine Piriformis Stretch with Leg Straight  - 2 x daily - 7 x weekly - 1 sets - 3 reps - 30 sec  hold - Supine Transversus Abdominis Bracing with Pelvic Floor Contraction  - 2 x daily - 7 x weekly - 1 sets - 10 reps - 10sec  hold  Patient Education - Trigger Point Dry Needling - TENS Unit - Office Posture - Posture and Body Mechanics - Kinesotape  ASSESSMENT:  CLINICAL IMPRESSION: Patient reports improvement with treatment and HEP. He has increased pain again today after sitting at work. Symptoms include low back pain and Lt LE numbness and  pain posterior hip to foot. Not as much numbness in the Lt foot as yesterday. Patient states that this episode of pain is not like prior episodes of back pain. He has not had the LE radicular symptoms in the past. Good response to initial exercises and today's treatment of DN and manual work. Trial of taping for neutral spine.   OBJECTIVE IMPAIRMENTS: hypomobility, increased fascial restrictions, increased muscle spasms, impaired flexibility, impaired sensation, improper body mechanics, postural dysfunction,  and pain.   ACTIVITY LIMITATIONS: sitting, standing, and sleeping  PARTICIPATION LIMITATIONS: occupation and yard work  PERSONAL FACTORS: Behavior pattern, Past/current experiences, Time since onset of injury/illness/exacerbation, and comorbidities: including recurrent back pain x 30 years; Lt MCL repair ~ 30 yrs inguinal hernia repair and current inguinal hernia ago are also affecting patient's functional outcome.   REHAB POTENTIAL: Good  CLINICAL DECISION MAKING: Stable/uncomplicated  EVALUATION COMPLEXITY: Low   GOALS: Goals reviewed with patient? Yes  SHORT TERM GOALS: Target date: 11/10/2022  Independent in initial HEP Baseline: Goal status: INITIAL  2.  Patient demonstrates improved sitting and standing posture and alignment  Baseline:  Goal status: INITIAL   LONG TERM GOALS: Target date: 12/08/2022   Increase bilat hip mobility in rotation  Baseline:  Goal status: INITIAL  2.  Decrease radicular Lt LE symptoms by 75-100% Baseline:  Goal status: INITIAL  3.  Improve core strength with patient to tolerate 30 min of exercise without increased radicular symptoms  Baseline:  Goal status: INITIAL  4.  Patient demonstrates and verbalizes good body mechanics and back care principles  Baseline:  Goal status: INITIAL  5.  Independent in HEP  Baseline:  Goal status: INITIAL  6.  Improve functional limitation score to 62 Baseline:  Goal status:  INITIAL  PLAN:  PT FREQUENCY: 2x/week  PT DURATION: 8 weeks  PLANNED INTERVENTIONS: Therapeutic exercises, Therapeutic activity, Neuromuscular re-education, Balance training, Gait training, Patient/Family education, Self Care, Joint mobilization, Aquatic Therapy, Dry Needling, Electrical stimulation, Spinal mobilization, Cryotherapy, Moist heat, Taping, Traction, Ultrasound, Ionotophoresis '4mg'$ /ml Dexamethasone, Manual therapy, and Re-evaluation.  PLAN FOR NEXT SESSION: review and progress exercises; manual work, DN, modalities as indicated - trial of DN Lt posterior hip    Johnn Krasowski Nilda Simmer, PT 10/14/2022, 12:41 PM

## 2022-10-21 ENCOUNTER — Ambulatory Visit: Payer: 59 | Admitting: Physical Therapy

## 2022-10-21 ENCOUNTER — Encounter: Payer: Self-pay | Admitting: Physical Therapy

## 2022-10-21 DIAGNOSIS — M545 Low back pain, unspecified: Secondary | ICD-10-CM | POA: Diagnosis not present

## 2022-10-21 DIAGNOSIS — R29898 Other symptoms and signs involving the musculoskeletal system: Secondary | ICD-10-CM

## 2022-10-21 DIAGNOSIS — M5459 Other low back pain: Secondary | ICD-10-CM

## 2022-10-21 DIAGNOSIS — M79662 Pain in left lower leg: Secondary | ICD-10-CM | POA: Diagnosis not present

## 2022-10-21 DIAGNOSIS — R293 Abnormal posture: Secondary | ICD-10-CM | POA: Diagnosis not present

## 2022-10-21 DIAGNOSIS — M79672 Pain in left foot: Secondary | ICD-10-CM | POA: Diagnosis not present

## 2022-10-21 DIAGNOSIS — M25552 Pain in left hip: Secondary | ICD-10-CM | POA: Diagnosis not present

## 2022-10-21 DIAGNOSIS — M5416 Radiculopathy, lumbar region: Secondary | ICD-10-CM

## 2022-10-21 DIAGNOSIS — R195 Other fecal abnormalities: Secondary | ICD-10-CM | POA: Diagnosis not present

## 2022-10-21 DIAGNOSIS — M62838 Other muscle spasm: Secondary | ICD-10-CM | POA: Diagnosis not present

## 2022-10-21 NOTE — Therapy (Addendum)
OUTPATIENT PHYSICAL THERAPY THORACOLUMBAR TREATMENT AND DISCHARGE  PHYSICAL THERAPY DISCHARGE SUMMARY  Visits from Start of Care: 3  Current functional level related to goals / functional outcomes: See below. Has not returned to check goals   Remaining deficits: See below.    Education / Equipment: See below   Patient agrees to discharge. Patient goals were not met. Patient is being discharged due to not returning since the last visit.   Patient Name: Clarence Jordan MRN: [id number] DOB:06-01-1961, 62 y.o., male Today's Date: 10/21/2022  END OF SESSION:  PT End of Session - 10/21/22 1150     Visit Number 3    Number of Visits 16    Date for PT Re-Evaluation 12/08/22    PT Start Time 1150    PT Stop Time 1230    PT Time Calculation (min) 40 min    Activity Tolerance Patient tolerated treatment well              Past Medical History:  Diagnosis Date   Hyperlipidemia    Kidney stones    Past Surgical History:  Procedure Laterality Date   fx orbital floor repair     rt.   INGUINAL HERNIA REPAIR     left   KNEE ARTHROSCOPY W/ MEDIAL COLLATERAL LIGAMENT (MCL) REPAIR     left   lasik surgury     O.U.   TONSILLECTOMY AND ADENOIDECTOMY     VASECTOMY     Patient Active Problem List   Diagnosis Date Noted   Positive fecal immunochemical test 10/16/2020   Inguinal hernia, right 02/06/2014   ANKLE PAIN, LEFT 11/16/2010    PCP: Dr Marton Redwood  REFERRING PROVIDER: Dr Marton Redwood  REFERRING DIAG: lumbar radiculopathy   Rationale for Evaluation and Treatment: Rehabilitation  THERAPY DIAG:  Radiculopathy, lumbar region  Other symptoms and signs involving the musculoskeletal system  Other low back pain  ONSET DATE: 01/25/22  SUBJECTIVE:                                                                                                                                                                                           SUBJECTIVE STATEMENT: Pt  states that he feels the stretching has been helping more than the dry needling. Not a huge difference the day after. Also did not feel a difference with the K-tape.   PERTINENT HISTORY:  Patient reports that he has had back problems for at least 3 decades. MRI showed spondylolisthesis. He has continued flare ups on an intermittent basis. He has had pain in the past 6-7 months with pain in the back and back of  the Lt hip and into the Lt LE to foot. Pain is increased with sitting; standing; lying down and better with movement ; MCL repair Lt knee 1991; hernia surgery ~ 20 yrs ago; currently as Rt inguinal hernia; trigger finger long finger Rt hand  PAIN:  Are you having pain? Yes: NPRS scale: 2/10 Pain location: Lt LB; posterior Lt hip to LE to foot  Pain description: sharp; throbbing at times; numbness mostly in Lt foot  Aggravating factors: sitting; standing; lying down  Relieving factors: nothing   PRECAUTIONS: None  WEIGHT BEARING RESTRICTIONS: No  FALLS:  Has patient fallen in last 6 months? 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  [employer] work; reading - recliner  Sleeps prone   PATIENT GOALS: get rid of the pain and prevent it from coming back   NEXT MD VISIT: as needed   OBJECTIVE:   DIAGNOSTIC FINDINGS:  None available for current episode of back pain   PATIENT SURVEYS:  FOTO 48; goal 40    SENSATION: Intermittent numbness in the Lt foot; sometimes Lt LE   MUSCLE LENGTH: Hamstrings: Right 75 deg; Left 70 deg  POSTURE: rounded shoulders, decreased lumbar lordosis, increased thoracic kyphosis, and flexed trunk   PALPATION: No pain with PA and lateral mobs lumbar spine  Muscular tightness Lt  posterior hip through the piriformis/gluts  LUMBAR ROM:   AROM eval  Flexion 85%  Extension 45%  Right lateral flexion 80%  Left lateral flexion 80%  Right rotation 60%  Left rotation 60%   (Blank rows = not tested)  LOWER EXTREMITY ROM:     Tight rotation Lt > Rt hips    LOWER EXTREMITY MMT:    LE strength WNL's throughout   LUMBAR SPECIAL TESTS:  Straight leg raise test: Negative Slump test: increased pain Lt posterior hip area no radicular changes    [facility] Adult PT Treatment:                                                DATE: 10/21/22 Therapeutic Exercise: Treadmill 1.5 to 2.2 mph x 5 min  Prone prop x1 min Prone press up 5x 10 sec Prone hip ext 2x10 Piriformis stretch supine travel 30 sec x 2  Figure 4 stretch x30 sec Transverse abdominal core activation 10 sec x 10 (difficult to coordinate transverse abdominals and multifidi) Bridge 10x5 sec with ab set S/L clamshell green TB 2x10 Captain morgan 5x10 sec  [facility] Adult PT Treatment:                                                 DATE: 10/14/22 Therapeutic Exercise: Treadmill 1.5 to 2.2 mph x 5 min  Prone press up 2 sec x 5 Prone prop ~ 1 min  Piriformis stretch supine travell 30 sec x 3  Diagonal knee to chest 30 sec x 3  Transverse abdominal core activation 10 sec x 10 (difficult to coordinate transverse abdominals and multifidi) Standing trunk extension 1-2 sec x 2  Manual Therapy: Joint mobs lumbar spine Grade II/II PA mobs Skilled palpation to assess response to DN and manual work through lumbar spine/posterior Lt hip  Trigger Point Dry-Needling  Treatment instructions: Expect mild to moderate muscle soreness. S/S of pneumothorax if dry needled over a lung field, and to seek immediate medical attention should they occur. Patient verbalized understanding of these instructions and  education.  Patient Consent Given: Yes Education handout provided: Previously provided Muscles treated: Lt glut min/max; piriformis  Electrical stimulation performed: Yes Parameters: mAmp current to pt tolerance  Treatment response/outcome: decreased palpable tightness   Kinesotaping - 1 strip either side of thoracolumbar spine w/ patient in slight trunk extension  Neuromuscular re-ed: Working on posture and alignment in standing and sitting  Self Care: Use of pool noodle to improve sitting posture  To modify recliner for home    PATIENT EDUCATION:  Education details: POC; HEP  Person educated: Patient Education method: Consulting civil engineer, Demonstration, Corporate treasurer cues, Verbal cues, and Handouts Education comprehension: verbalized understanding, returned demonstration, verbal cues required, tactile cues required, and needs further education  HOME EXERCISE PROGRAM: Access Code: [email or web address] URL: [email or web address] Date: 10/13/2022 Prepared by: Gillermo Murdoch  Exercises - Prone Press Up  - 2 x daily - 7 x weekly - 1 sets - 10 reps - 2-3 sec  hold - Prone Press Up On Elbows  - 2 x daily - 7 x weekly - 1 sets - 3 reps - 30 sec  hold - Standing Lumbar Extension  - 2 x daily - 7 x weekly - 1 sets - 2-3 reps - 2-3 sec  hold - Supine Piriformis Stretch with Leg Straight  - 2 x daily - 7 x weekly - 1 sets - 3 reps - 30 sec  hold - Supine Transversus Abdominis Bracing with Pelvic Floor Contraction  - 2 x daily - 7 x weekly - 1 sets - 10 reps - 10sec  hold  Patient Education - Trigger Point Dry Needling - TENS Unit - Office Posture - Posture and Body Mechanics - Kinesotape  ASSESSMENT:  CLINICAL IMPRESSION: Pt states he did not feel TPDN was any more effective than his normal stretching. Worked on continuing stretching, core and hip strengthening. Initiated band exercises for glute and piriformis.   GOALS: Goals reviewed with patient? Yes  SHORT TERM GOALS: Target date:  11/10/2022  Independent in initial HEP Baseline: Goal status: INITIAL  2.  Patient demonstrates improved sitting and standing posture and alignment  Baseline:  Goal status: INITIAL   LONG TERM GOALS: Target date: 12/08/2022   Increase bilat hip mobility in rotation  Baseline:  Goal status: INITIAL  2.  Decrease radicular Lt LE symptoms by 75-100% Baseline:  Goal status: INITIAL  3.  Improve core strength with patient to tolerate 30 min of exercise  without increased radicular symptoms  Baseline:  Goal status: INITIAL  4.  Patient demonstrates and verbalizes good body mechanics and back care principles  Baseline:  Goal status: INITIAL  5.  Independent in HEP  Baseline:  Goal status: INITIAL  6.  Improve functional limitation score to 62 Baseline:  Goal status: INITIAL  PLAN:  PT FREQUENCY: 2x/week  PT DURATION: 8 weeks  PLANNED INTERVENTIONS: Therapeutic exercises, Therapeutic activity, Neuromuscular re-education, Balance training, Gait training, Patient/Family education, Self Care, Joint mobilization, Aquatic Therapy, Dry Needling, Electrical stimulation, Spinal mobilization, Cryotherapy, Moist heat, Taping, Traction, Ultrasound, Ionotophoresis '4mg'$ /ml Dexamethasone, Manual therapy, and Re-evaluation.  PLAN FOR NEXT SESSION: review and progress exercises; manual work, DN, modalities as indicated - trial of DN Lt posterior hip    [place] Weekly April Ma L Kennady Zimmerle, PT 10/21/2022, 11:51 AM

## 2022-11-02 ENCOUNTER — Encounter: Payer: Self-pay | Admitting: Rehabilitative and Restorative Service Providers"

## 2022-11-09 ENCOUNTER — Ambulatory Visit: Payer: 59 | Admitting: Rehabilitative and Restorative Service Providers"

## 2022-11-24 ENCOUNTER — Other Ambulatory Visit (HOSPITAL_BASED_OUTPATIENT_CLINIC_OR_DEPARTMENT_OTHER): Payer: Self-pay

## 2022-11-24 DIAGNOSIS — K648 Other hemorrhoids: Secondary | ICD-10-CM | POA: Diagnosis not present

## 2022-11-24 DIAGNOSIS — K625 Hemorrhage of anus and rectum: Secondary | ICD-10-CM | POA: Diagnosis not present

## 2022-11-24 MED ORDER — HYDROCORTISONE ACETATE 25 MG RE SUPP
25.0000 mg | Freq: Two times a day (BID) | RECTAL | 0 refills | Status: AC
  Filled 2022-11-24: qty 20, 10d supply, fill #0

## 2023-10-07 DIAGNOSIS — R7301 Impaired fasting glucose: Secondary | ICD-10-CM | POA: Diagnosis not present

## 2023-10-07 DIAGNOSIS — Z125 Encounter for screening for malignant neoplasm of prostate: Secondary | ICD-10-CM | POA: Diagnosis not present

## 2023-10-07 DIAGNOSIS — E785 Hyperlipidemia, unspecified: Secondary | ICD-10-CM | POA: Diagnosis not present

## 2023-10-14 ENCOUNTER — Other Ambulatory Visit (HOSPITAL_BASED_OUTPATIENT_CLINIC_OR_DEPARTMENT_OTHER): Payer: Self-pay

## 2023-10-14 DIAGNOSIS — M4310 Spondylolisthesis, site unspecified: Secondary | ICD-10-CM | POA: Diagnosis not present

## 2023-10-14 DIAGNOSIS — G4700 Insomnia, unspecified: Secondary | ICD-10-CM | POA: Diagnosis not present

## 2023-10-14 DIAGNOSIS — Z1331 Encounter for screening for depression: Secondary | ICD-10-CM | POA: Diagnosis not present

## 2023-10-14 DIAGNOSIS — N401 Enlarged prostate with lower urinary tract symptoms: Secondary | ICD-10-CM | POA: Diagnosis not present

## 2023-10-14 DIAGNOSIS — R82998 Other abnormal findings in urine: Secondary | ICD-10-CM | POA: Diagnosis not present

## 2023-10-14 DIAGNOSIS — Z0000 Encounter for general adult medical examination without abnormal findings: Secondary | ICD-10-CM | POA: Diagnosis not present

## 2023-10-14 DIAGNOSIS — Z1339 Encounter for screening examination for other mental health and behavioral disorders: Secondary | ICD-10-CM | POA: Diagnosis not present

## 2023-10-14 DIAGNOSIS — K4090 Unilateral inguinal hernia, without obstruction or gangrene, not specified as recurrent: Secondary | ICD-10-CM | POA: Diagnosis not present

## 2023-10-14 DIAGNOSIS — M5416 Radiculopathy, lumbar region: Secondary | ICD-10-CM | POA: Diagnosis not present

## 2023-10-14 DIAGNOSIS — E785 Hyperlipidemia, unspecified: Secondary | ICD-10-CM | POA: Diagnosis not present

## 2023-10-14 DIAGNOSIS — R7301 Impaired fasting glucose: Secondary | ICD-10-CM | POA: Diagnosis not present

## 2023-10-14 MED ORDER — TAMSULOSIN HCL 0.4 MG PO CAPS
0.4000 mg | ORAL_CAPSULE | Freq: Every day | ORAL | 11 refills | Status: AC
  Filled 2023-10-14: qty 30, 30d supply, fill #0

## 2023-10-14 MED ORDER — EZETIMIBE 10 MG PO TABS
10.0000 mg | ORAL_TABLET | Freq: Every day | ORAL | 3 refills | Status: AC
  Filled 2023-10-14: qty 90, 90d supply, fill #0
  Filled 2024-01-08: qty 90, 90d supply, fill #1
  Filled 2024-04-09: qty 90, 90d supply, fill #2
  Filled 2024-07-08: qty 90, 90d supply, fill #3

## 2023-10-14 MED ORDER — ROSUVASTATIN CALCIUM 10 MG PO TABS
10.0000 mg | ORAL_TABLET | Freq: Every day | ORAL | 3 refills | Status: AC
  Filled 2023-10-14: qty 90, 90d supply, fill #0
  Filled 2024-01-08: qty 90, 90d supply, fill #1
  Filled 2024-04-09: qty 90, 90d supply, fill #2
  Filled 2024-07-08: qty 90, 90d supply, fill #3

## 2023-10-19 ENCOUNTER — Other Ambulatory Visit (HOSPITAL_BASED_OUTPATIENT_CLINIC_OR_DEPARTMENT_OTHER): Payer: Self-pay

## 2024-01-04 ENCOUNTER — Other Ambulatory Visit (HOSPITAL_BASED_OUTPATIENT_CLINIC_OR_DEPARTMENT_OTHER): Payer: Self-pay

## 2024-01-04 MED ORDER — HYDROCORTISONE ACETATE 25 MG RE SUPP
25.0000 mg | Freq: Two times a day (BID) | RECTAL | 0 refills | Status: AC
  Filled 2024-01-04: qty 28, 14d supply, fill #0

## 2024-04-05 ENCOUNTER — Other Ambulatory Visit (HOSPITAL_BASED_OUTPATIENT_CLINIC_OR_DEPARTMENT_OTHER): Payer: Self-pay

## 2024-04-05 MED ORDER — TRIAMCINOLONE ACETONIDE 0.5 % EX CREA
TOPICAL_CREAM | CUTANEOUS | 1 refills | Status: AC
  Filled 2024-04-05: qty 60, 30d supply, fill #0

## 2024-04-09 ENCOUNTER — Other Ambulatory Visit (HOSPITAL_BASED_OUTPATIENT_CLINIC_OR_DEPARTMENT_OTHER): Payer: Self-pay

## 2024-05-18 ENCOUNTER — Other Ambulatory Visit (HOSPITAL_BASED_OUTPATIENT_CLINIC_OR_DEPARTMENT_OTHER): Payer: Self-pay

## 2024-05-18 DIAGNOSIS — N442 Benign cyst of testis: Secondary | ICD-10-CM | POA: Diagnosis not present

## 2024-05-18 DIAGNOSIS — L089 Local infection of the skin and subcutaneous tissue, unspecified: Secondary | ICD-10-CM | POA: Diagnosis not present

## 2024-05-18 MED ORDER — DOXYCYCLINE HYCLATE 100 MG PO TABS
100.0000 mg | ORAL_TABLET | Freq: Two times a day (BID) | ORAL | 0 refills | Status: AC
  Filled 2024-05-18: qty 28, 14d supply, fill #0

## 2024-10-15 ENCOUNTER — Other Ambulatory Visit (HOSPITAL_BASED_OUTPATIENT_CLINIC_OR_DEPARTMENT_OTHER): Payer: Self-pay

## 2024-10-15 ENCOUNTER — Other Ambulatory Visit (HOSPITAL_COMMUNITY): Payer: Self-pay

## 2024-10-15 MED ORDER — ROSUVASTATIN CALCIUM 10 MG PO TABS
10.0000 mg | ORAL_TABLET | Freq: Every day | ORAL | 3 refills | Status: AC
  Filled 2024-10-15: qty 90, 90d supply, fill #0

## 2024-10-15 MED ORDER — ROSUVASTATIN CALCIUM 10 MG PO TABS: 10.0000 mg | ORAL_TABLET | Freq: Every day | ORAL | 3 refills | Status: AC

## 2024-10-15 MED ORDER — EZETIMIBE 10 MG PO TABS
10.0000 mg | ORAL_TABLET | Freq: Every day | ORAL | 3 refills | Status: AC
  Filled 2024-10-15: qty 90, 90d supply, fill #0

## 2024-10-22 ENCOUNTER — Other Ambulatory Visit (HOSPITAL_BASED_OUTPATIENT_CLINIC_OR_DEPARTMENT_OTHER): Payer: Self-pay
